# Patient Record
Sex: Female | Born: 1975
Health system: Southern US, Community
[De-identification: ages and names within clinical notes are randomized; demographics above are authoritative.]

## PROBLEM LIST (undated history)

## (undated) DIAGNOSIS — E663 Overweight: Secondary | ICD-10-CM

## (undated) DIAGNOSIS — F419 Anxiety disorder, unspecified: Secondary | ICD-10-CM

## (undated) DIAGNOSIS — J45909 Unspecified asthma, uncomplicated: Secondary | ICD-10-CM

## (undated) DIAGNOSIS — R112 Nausea with vomiting, unspecified: Secondary | ICD-10-CM

## (undated) DIAGNOSIS — R7303 Prediabetes: Secondary | ICD-10-CM

## (undated) DIAGNOSIS — K219 Gastro-esophageal reflux disease without esophagitis: Secondary | ICD-10-CM

## (undated) DIAGNOSIS — D649 Anemia, unspecified: Secondary | ICD-10-CM

## (undated) DIAGNOSIS — R519 Headache, unspecified: Secondary | ICD-10-CM

## (undated) DIAGNOSIS — Z9889 Other specified postprocedural states: Secondary | ICD-10-CM

## (undated) DIAGNOSIS — M722 Plantar fascial fibromatosis: Secondary | ICD-10-CM

## (undated) DIAGNOSIS — C801 Malignant (primary) neoplasm, unspecified: Secondary | ICD-10-CM

## (undated) DIAGNOSIS — T8859XA Other complications of anesthesia, initial encounter: Secondary | ICD-10-CM

## (undated) DIAGNOSIS — Z8489 Family history of other specified conditions: Secondary | ICD-10-CM

## (undated) DIAGNOSIS — Z87442 Personal history of urinary calculi: Secondary | ICD-10-CM

## (undated) HISTORY — PX: INNER EAR SURGERY: SHX679

## (undated) HISTORY — DX: Plantar fascial fibromatosis: M72.2

## (undated) HISTORY — PX: COLONOSCOPY: SHX174

## (undated) HISTORY — PX: WISDOM TOOTH EXTRACTION: SHX21

## (undated) HISTORY — DX: Overweight: E66.3

## (undated) HISTORY — PX: CHOLESTEATOMA EXCISION: SHX1345

## (undated) HISTORY — DX: Malignant (primary) neoplasm, unspecified: C80.1

---

## 2005-06-30 ENCOUNTER — Ambulatory Visit: Payer: Self-pay | Admitting: Unknown Physician Specialty

## 2007-09-23 ENCOUNTER — Ambulatory Visit: Payer: Self-pay | Admitting: Unknown Physician Specialty

## 2008-07-10 ENCOUNTER — Ambulatory Visit: Payer: Self-pay

## 2010-04-11 ENCOUNTER — Ambulatory Visit: Payer: Self-pay | Admitting: Unknown Physician Specialty

## 2011-03-13 ENCOUNTER — Other Ambulatory Visit: Payer: Self-pay | Admitting: Internal Medicine

## 2011-03-13 MED ORDER — ZOLPIDEM TARTRATE 10 MG PO TABS: 10.0000 mg | ORAL_TABLET | Freq: Every evening | ORAL | Status: DC | PRN

## 2011-03-16 ENCOUNTER — Other Ambulatory Visit: Payer: Self-pay | Admitting: Internal Medicine

## 2011-03-20 MED ORDER — ZOLPIDEM TARTRATE 10 MG PO TABS: 10.0000 mg | ORAL_TABLET | Freq: Every evening | ORAL | Status: DC | PRN

## 2011-06-09 LAB — HM PAP SMEAR: HM Pap smear: NORMAL

## 2011-06-20 DIAGNOSIS — C801 Malignant (primary) neoplasm, unspecified: Secondary | ICD-10-CM | POA: Insufficient documentation

## 2011-06-20 HISTORY — PX: APPENDECTOMY: SHX54

## 2011-06-20 HISTORY — PX: LEFT OOPHORECTOMY: SHX1961

## 2011-06-20 HISTORY — DX: Malignant (primary) neoplasm, unspecified: C80.1

## 2011-06-23 ENCOUNTER — Ambulatory Visit: Payer: Self-pay | Admitting: Obstetrics & Gynecology

## 2011-06-23 LAB — CBC
HCT: 40.1 % (ref 35.0–47.0)
HGB: 13.6 g/dL (ref 12.0–16.0)
MCH: 30.5 pg (ref 26.0–34.0)
MCHC: 33.9 g/dL (ref 32.0–36.0)
MCV: 90 fL (ref 80–100)
Platelet: 296 10*3/uL (ref 150–440)
RBC: 4.45 10*6/uL (ref 3.80–5.20)
RDW: 12.6 % (ref 11.5–14.5)
WBC: 4.9 10*3/uL (ref 3.6–11.0)

## 2011-06-30 ENCOUNTER — Ambulatory Visit: Payer: Self-pay | Admitting: Obstetrics & Gynecology

## 2011-07-20 LAB — PATHOLOGY REPORT

## 2011-07-26 ENCOUNTER — Ambulatory Visit: Payer: Self-pay | Admitting: Gynecologic Oncology

## 2011-08-08 ENCOUNTER — Ambulatory Visit: Payer: Self-pay | Admitting: Gynecologic Oncology

## 2011-08-16 ENCOUNTER — Ambulatory Visit: Payer: Self-pay | Admitting: Gynecologic Oncology

## 2011-08-16 LAB — BASIC METABOLIC PANEL
Anion Gap: 11 (ref 7–16)
Calcium, Total: 9.2 mg/dL (ref 8.5–10.1)
Chloride: 105 mmol/L (ref 98–107)
Co2: 26 mmol/L (ref 21–32)
Creatinine: 0.65 mg/dL (ref 0.60–1.30)
EGFR (African American): >60
EGFR (Non-African Amer.): >60
Osmolality: 282 (ref 275–301)

## 2011-08-16 LAB — CBC
HCT: 39.3 % (ref 35.0–47.0)
HGB: 13.2 g/dL (ref 12.0–16.0)
MCH: 30.1 pg (ref 26.0–34.0)
MCHC: 33.5 g/dL (ref 32.0–36.0)
MCV: 90 fL (ref 80–100)
RBC: 4.38 10*6/uL (ref 3.80–5.20)
RDW: 12.6 % (ref 11.5–14.5)

## 2011-08-18 ENCOUNTER — Ambulatory Visit: Payer: Self-pay | Admitting: Gynecologic Oncology

## 2011-08-22 ENCOUNTER — Other Ambulatory Visit: Payer: Self-pay | Admitting: Internal Medicine

## 2011-08-22 ENCOUNTER — Ambulatory Visit: Payer: Self-pay | Admitting: Gynecologic Oncology

## 2011-08-22 LAB — PREGNANCY, URINE: Pregnancy Test, Urine: NEGATIVE m[IU]/mL

## 2011-08-23 MED ORDER — ZOLPIDEM TARTRATE 10 MG PO TABS: 10.0000 mg | ORAL_TABLET | Freq: Every evening | ORAL | Status: DC | PRN

## 2011-08-25 LAB — PATHOLOGY REPORT

## 2011-09-14 ENCOUNTER — Ambulatory Visit (INDEPENDENT_AMBULATORY_CARE_PROVIDER_SITE_OTHER): Payer: 59 | Admitting: Internal Medicine

## 2011-09-14 ENCOUNTER — Encounter: Payer: Self-pay | Admitting: Internal Medicine

## 2011-09-14 VITALS — BP 112/64 | HR 110 | Temp 98.2°F | Resp 16 | Ht 66.0 in | Wt 178.5 lb

## 2011-09-14 DIAGNOSIS — C562 Malignant neoplasm of left ovary: Secondary | ICD-10-CM

## 2011-09-14 DIAGNOSIS — R41840 Attention and concentration deficit: Secondary | ICD-10-CM

## 2011-09-14 DIAGNOSIS — Z6825 Body mass index (BMI) 25.0-25.9, adult: Secondary | ICD-10-CM

## 2011-09-14 DIAGNOSIS — C569 Malignant neoplasm of unspecified ovary: Secondary | ICD-10-CM

## 2011-09-14 DIAGNOSIS — E663 Overweight: Secondary | ICD-10-CM

## 2011-09-14 DIAGNOSIS — F988 Other specified behavioral and emotional disorders with onset usually occurring in childhood and adolescence: Secondary | ICD-10-CM

## 2011-09-14 MED ORDER — BUPROPION HCL 75 MG PO TABS: 75.0000 mg | ORAL_TABLET | Freq: Two times a day (BID) | ORAL | Status: DC

## 2011-09-14 NOTE — Patient Instructions (Signed)
We are going to try wellbutrin 75 mg twice daily for your concentration issues.  Take 1st dose when you wake up,  2nd dose around 1 pm to avoid sleep problems.  Return in one month  If you are tolerating well ,  But feel no difference at 2 weeks,   Call for an increased dose (100 mg)

## 2011-09-14 NOTE — Progress Notes (Signed)
Patient ID: Dawn [name], female   DOB: [birth year], 36 y.o.   MRN: [id number]  Patient Active Problem List  Diagnoses  . Overweight (BMI 25.0-29.9)  . Plantar fasciitis  . Attention and concentration deficit  . Ovarian cancer on left    Subjective:  CC:   Chief Complaint  Patient presents with  . Follow-up    problems focusing    HPI:   Dawn Lawrenceis a 36 y.o. female who presents with a 3 month history of trouble focusing at work,  Noticed by patient and coworkers.  Initially she attributed her lack of concentration to the  stress of her recent diagnosis of ovarian cancer. She has no prior  diagnosis of ADD but she recalls that since high school, she has been inattentive during and  recalling conversations with other people.  She finds herself looking around the room and losing interest after a minute or less of conversation.  ("I've always been a multitasker, but now I can't seem to do as many things as one.)  Has chronic sleep issues because of a "neurologic jump" into REM which wakes up her up (sleep study by [employer] 2010,  Controlled with Remus Loffler)   Recent stressor includes diagnosis of granulosa cell CA , left ovary, s/p oophorectomy.  Several months ago she felt a lower abdominal mass and her annual GYN exam in November revealed an enlarged uterus.   Ultrasound in Dec which showed 14 cm mass,  Acc by increased pain and bleeding which had been attributed to prior remote C section.  January  Underwent laparatomy by Dr. Tiburcio Pea, [facility] showed a large cyst, they drained it and removed 1/2 of left ovary and left tube,  Path report was Granulosa cell tumor  3 mm,  Contained. Referred to Oncology.  Wendy Poet, [facility], was told this type of ovarian ca was unlikely to return,  But pt wanted entire ovary and tube removed, so during removal  no tube was found on the right .   So patient now is dealing with the realization that she definitely will never conceive  again.       Past Medical History  Diagnosis Date  . Overweight (BMI 25.0-29.9)   . Plantar fasciitis     Past Surgical History  Procedure Date  . Cesarean section 2004    breech birth  . Cholesteatoma excision     [facility], with some resiudal loss of hearing         The following portions of the patient's history were reviewed and updated as appropriate: Allergies, current medications, and problem list.    Review of Systems:   12 Pt  review of systems was negative except those addressed in the HPI,     History   Social History  . Marital Status: Married    Spouse Name: N/A    Number of Children: N/A  . Years of Education: N/A   Occupational History  . Not on file.   Social History Main Topics  . Smoking status: Never Smoker   . Smokeless tobacco: Never Used  . Alcohol Use: Yes  . Drug Use: No  . Sexually Active: Not on file   Other Topics Concern  . Not on file   Social History Narrative  . No narrative on file    Objective:  BP 112/64  Pulse 110  Temp(Src) 98.2 F (36.8 C) (Oral)  Resp 16  Ht 5\' 6"  (1.676 m)  Wt 178 lb 8 oz (  80.967 kg)  BMI 28.81 kg/m2  SpO2 100%  LMP 08/23/2011  General appearance: alert, cooperative and appears stated age Ears: normal TM's and external ear canals both ears Throat: lips, mucosa, and tongue normal; teeth and gums normal Neck: no adenopathy, no carotid bruit, supple, symmetrical, trachea midline and thyroid not enlarged, symmetric, no tenderness/mass/nodules Back: symmetric, no curvature. ROM normal. No CVA tenderness. Lungs: clear to auscultation bilaterally Heart: regular rate and rhythm, S1, S2 normal, no murmur, click, rub or gallop Abdomen: soft, non-tender; bowel sounds normal; no masses,  no organomegaly Pulses: 2+ and symmetric Skin: Skin color, texture, turgor normal. No rashes or lesions Lymph nodes: Cervical, supraclavicular, and axillary nodes normal.  Assessment and Plan:  Attention and  concentration deficit Discussed need for formal evaluation prior to using stimulant therapy given her history of insomnia .  Trial of wellbutrin first.   Ovarian cancer on left Found in January during resection of left ovarian cyst.  She is s/p completel resection, granulosa cell,  Not likely to return per Oncology.   Overweight (BMI 25.0-29.9) She has a history of phentermine use , prescribed by me in June 2011 for persistent weight gain despite regular exercise and carbohydrate restriction.  At that time her weight was 177 and she lost 15 lbs in 4 months before stopping the medication.  She has regained 16 lbs but has not been exercising due to her recent surgery. I have addressed  BMI and recommended a low glycemic index diet utilizing smaller more frequent meals to increase metabolism.  I have also recommended that patient start exercising with a goal of 30 minutes of aerobic exercise a minimum of 5 days per week.        Updated Medication List Outpatient Encounter Prescriptions as of 09/14/2011  Medication Sig Dispense Refill  . cetirizine (ZYRTEC) 10 MG tablet Take 10 mg by mouth daily.      [name] docusate sodium (COLACE) 50 MG capsule Take by mouth 2 (two) times daily.      [name] FIBER FORMULA PO Take by mouth.      . Multiple Vitamin (MULTIVITAMIN) tablet Take 1 tablet by mouth daily.      . vitamin B-12 (CYANOCOBALAMIN) 100 MCG tablet Take 50 mcg by mouth daily.      [name] zolpidem (AMBIEN) 10 MG tablet Take 5 mg by mouth at bedtime as needed.      [name] DISCONTD: zolpidem (AMBIEN) 10 MG tablet Take 1 tablet (10 mg total) by mouth at bedtime as needed for sleep.  30 tablet  2  . buPROPion (WELLBUTRIN) 75 MG tablet Take 1 tablet (75 mg total) by mouth 2 (two) times daily.  60 tablet  1     No orders of the defined types were placed in this encounter.    No Follow-up on file.

## 2011-09-17 ENCOUNTER — Encounter: Payer: Self-pay | Admitting: Internal Medicine

## 2011-09-17 DIAGNOSIS — Z8543 Personal history of malignant neoplasm of ovary: Secondary | ICD-10-CM | POA: Insufficient documentation

## 2011-09-17 DIAGNOSIS — R41840 Attention and concentration deficit: Secondary | ICD-10-CM | POA: Insufficient documentation

## 2011-09-17 DIAGNOSIS — C562 Malignant neoplasm of left ovary: Secondary | ICD-10-CM | POA: Insufficient documentation

## 2011-09-17 DIAGNOSIS — Z6826 Body mass index (BMI) 26.0-26.9, adult: Secondary | ICD-10-CM | POA: Insufficient documentation

## 2011-09-17 DIAGNOSIS — E663 Overweight: Secondary | ICD-10-CM | POA: Insufficient documentation

## 2011-09-17 DIAGNOSIS — M722 Plantar fascial fibromatosis: Secondary | ICD-10-CM | POA: Insufficient documentation

## 2011-09-17 NOTE — Assessment & Plan Note (Signed)
Discussed need for formal evaluation prior to using stimulant therapy given her history of insomnia .  Trial of wellbutrin first.

## 2011-09-17 NOTE — Assessment & Plan Note (Addendum)
She has a history of phentermine use , prescribed by me in June 2011 for persistent weight gain despite regular exercise and carbohydrate restriction.  At that time her weight was 177 and she lost 15 lbs in 4 months before stopping the medication.  She has regained 16 lbs but has not been exercising due to her recent surgery. I have addressed  BMI and recommended a low glycemic index diet utilizing smaller more frequent meals to increase metabolism.  I have also recommended that patient start exercising with a goal of 30 minutes of aerobic exercise a minimum of 5 days per week.

## 2011-09-17 NOTE — Assessment & Plan Note (Signed)
Found in January during resection of left ovarian cyst.  She is s/p completel resection, granulosa cell,  Not likely to return per Oncology.

## 2011-09-18 ENCOUNTER — Ambulatory Visit: Payer: Self-pay | Admitting: Gynecologic Oncology

## 2011-09-21 ENCOUNTER — Telehealth: Payer: Self-pay | Admitting: Internal Medicine

## 2011-09-21 ENCOUNTER — Encounter: Payer: Self-pay | Admitting: Internal Medicine

## 2011-09-21 NOTE — Telephone Encounter (Signed)
[phone] Pt called she has been trying to loose weight and her employer is offier nutrion counceling and she needs referral for this.  She will fax paperwork for you to fill out and fax back

## 2011-09-21 NOTE — Telephone Encounter (Signed)
We received the paperwork, and it is in the red folder to be signed.  I will fax once it has been filled out.

## 2011-09-28 ENCOUNTER — Telehealth: Payer: Self-pay | Admitting: Internal Medicine

## 2011-09-28 DIAGNOSIS — F988 Other specified behavioral and emotional disorders with onset usually occurring in childhood and adolescence: Secondary | ICD-10-CM

## 2011-09-28 MED ORDER — BUPROPION HCL 100 MG PO TABS: 100.0000 mg | ORAL_TABLET | Freq: Two times a day (BID) | ORAL | Status: DC

## 2011-09-28 NOTE — Telephone Encounter (Signed)
Patient called and stated you asked her to call and let you know how the Wellbutrin is working for her.  She stated she hasn't noticed it helping with her focusing, but she has noticed that she is not as angry and doesn't get as upset as she normally would with certain situations.

## 2011-09-28 NOTE — Telephone Encounter (Signed)
I would recommend increasing dose to 100 mg twice daily .  New rx in EPIc

## 2011-09-29 MED ORDER — BUPROPION HCL 100 MG PO TABS: 100.0000 mg | ORAL_TABLET | Freq: Two times a day (BID) | ORAL | Status: DC

## 2011-09-29 NOTE — Telephone Encounter (Signed)
Patient notified, Rx called in. 

## 2011-10-12 ENCOUNTER — Telehealth: Payer: Self-pay | Admitting: Internal Medicine

## 2011-10-12 ENCOUNTER — Other Ambulatory Visit: Payer: Self-pay | Admitting: Internal Medicine

## 2011-10-12 DIAGNOSIS — F988 Other specified behavioral and emotional disorders with onset usually occurring in childhood and adolescence: Secondary | ICD-10-CM

## 2011-10-12 MED ORDER — BUPROPION HCL ER (SR) 150 MG PO TB12: 150.0000 mg | ORAL_TABLET | Freq: Two times a day (BID) | ORAL | Status: DC

## 2011-10-12 NOTE — Telephone Encounter (Signed)
i recommend increasing the dose of wellbutrin  to 150 mg twice daily ,  i have called it in to her pharmacy,  If she wants to push her may 1 appt out to fgice the increase time,  Move it to June

## 2011-10-12 NOTE — Telephone Encounter (Signed)
Patient stated she has not noticed a change in the increased dose of Wellbutrin, she wanted to know what is the next step.  Please advise.

## 2011-10-12 NOTE — Telephone Encounter (Signed)
[phone] Pt called stated her wellbutrin still is not working very well.  Please advise pt what to do.  Pt has appointment 10/18/11 pt wanted to know if she needs to come in for this appointment or should she push it out later in the month

## 2011-10-13 NOTE — Telephone Encounter (Signed)
Patient notified, she rescheduled for June 4th.

## 2011-10-17 ENCOUNTER — Ambulatory Visit: Payer: 59 | Admitting: Internal Medicine

## 2011-10-18 ENCOUNTER — Ambulatory Visit: Payer: 59 | Admitting: Internal Medicine

## 2011-11-21 ENCOUNTER — Ambulatory Visit (INDEPENDENT_AMBULATORY_CARE_PROVIDER_SITE_OTHER): Payer: 59 | Admitting: Internal Medicine

## 2011-11-21 ENCOUNTER — Encounter: Payer: Self-pay | Admitting: Internal Medicine

## 2011-11-21 VITALS — BP 112/68 | HR 93 | Temp 98.1°F | Resp 16 | Wt 176.5 lb

## 2011-11-21 DIAGNOSIS — C562 Malignant neoplasm of left ovary: Secondary | ICD-10-CM

## 2011-11-21 DIAGNOSIS — C569 Malignant neoplasm of unspecified ovary: Secondary | ICD-10-CM

## 2011-11-21 DIAGNOSIS — R41840 Attention and concentration deficit: Secondary | ICD-10-CM

## 2011-11-21 DIAGNOSIS — Z13220 Encounter for screening for lipoid disorders: Secondary | ICD-10-CM

## 2011-11-21 DIAGNOSIS — E663 Overweight: Secondary | ICD-10-CM

## 2011-11-21 DIAGNOSIS — R5383 Other fatigue: Secondary | ICD-10-CM

## 2011-11-21 LAB — CBC WITH DIFFERENTIAL/PLATELET
Basophils Absolute: 0 10*3/uL (ref 0.0–0.1)
Basophils Relative: 0.4 % (ref 0.0–3.0)
Eosinophils Absolute: 0.1 10*3/uL (ref 0.0–0.7)
Lymphocytes Relative: 32.5 % (ref 12.0–46.0)
MCHC: 33 g/dL (ref 30.0–36.0)
Monocytes Relative: 8.5 % (ref 3.0–12.0)
Neutrophils Relative %: 56.9 % (ref 43.0–77.0)
RBC: 4.48 Mil/uL (ref 3.87–5.11)

## 2011-11-21 LAB — TSH: TSH: 1.01 u[IU]/mL (ref 0.35–5.50)

## 2011-11-21 LAB — LIPID PANEL
Cholesterol: 205 mg/dL — ABNORMAL HIGH (ref 0–200)
Total CHOL/HDL Ratio: 4
Triglycerides: 184 mg/dL — ABNORMAL HIGH (ref 0.0–149.0)

## 2011-11-21 MED ORDER — BUPROPION HCL ER (XL) 300 MG PO TB24: 300.0000 mg | ORAL_TABLET | Freq: Every day | ORAL | Status: DC

## 2011-11-21 NOTE — Patient Instructions (Signed)
Consider the Low Glycemic Index Diet and 6 smaller meals daily .  This boosts your metabolism and regulates your sugars:   7 AM Low carbohydrate Protein  Shakes (EAS Carb Control  Or Atkins ,  Available everywhere,   In  cases at [employer] )  2.5 carbs  (Add or substitute an Arnold's  toasted sandwhich thin w/ peanut butter)  10 AM: Protein bar by Atkins (snack size,  Chocolate lover's variety at  [employer])    Lunch: sandwich on pita bread or flatbread (Joseph's makes a pita bread and a flat bread , available at [facility] and [employer] (both are 50 cal and 3 net carbs) ; Toufayah makes a low carb flatbread available at [employer] and [employer]) Mission makes a low carb whole wheat tortilla available at [employer] most grocery stores   3 PM:  Mid day :  Another protein bar,  Or a  cheese stick, 1/4 cup of almonds, walnuts, pistachios, pecans, peanuts,  Macadamia nuts  6 PM  Dinner:  "mean and green:"  Meat/chicken/fish, salad, and green veggie : use ranch, vinagrette,  Blue cheese, etc.  Avoid FAT FREE   9 PM snack : Breyer's low carb fudgsicle (100 cal,  3 carbs)  or  ice cream bar (Carb Smart), or  Weight Watcher's ice cream bar , or another protein shake  substitutions should be < 20 carbs/serving

## 2011-11-21 NOTE — Assessment & Plan Note (Signed)
We discussed increasing the wellbutrin to 300 mg daily vs starting ritalin.   since the wellbutrin is also helping her mood, we agreed to increase the dose first rather than switching.

## 2011-11-21 NOTE — Assessment & Plan Note (Signed)
Patient is addressing weight with low glycemic index diet [facility])

## 2011-11-21 NOTE — Assessment & Plan Note (Signed)
ganulosa cell, per Dr. Hyacinth Meeker, not likely to recur,  S/p oophorectomy, left, with missing fallopian tube on the right.

## 2011-11-21 NOTE — Progress Notes (Signed)
Patient ID: Dawn [name], female   DOB: 19-Apr-1976, 36 y.o.   MRN: [id number] Patient Active Problem List  Diagnoses  . Overweight (BMI 25.0-29.9)  . Plantar fasciitis  . Attention and concentration deficit  . Ovarian cancer on left  . granulosa cell tumor    Subjective:  CC:   Chief Complaint  Patient presents with  . Follow-up    HPI:   Dawn Lawrenceis a 36 y.o. female who presents for follow up on newly diagnosed attention deficit issues.  In the last 2 months she has suffered from recurrent vaginal infections which Dr. Hyacinth Meeker told her was common post oopherectomy/laparatomy , all of which were treated with metrogel.   She has been tolerating wellbutrin, feels it is helping her mood.  She is experiencing less irritability, but it has had no effect on her ability to concentrate and focus on work yet.  She is not taking the phentermine and has not used it in months.  She started [employer] yesterday for weight loss but did not tolerate the dinner last night due to increased bloating from the vegetables .    Past Medical History  Diagnosis Date  . Overweight (BMI 25.0-29.9)   . Plantar fasciitis   . granulosa cell tumor Jan 2013    left ovary     Past Surgical History  Procedure Date  . Cesarean section 2004    breech birth  . Cholesteatoma excision     [facility], with some resiudal loss of hearing  . Left oophorectomy 2013    secondary to granulosa cell tumor         The following portions of the patient's history were reviewed and updated as appropriate: Allergies, current medications, and problem list.    Review of Systems:   12 Pt  review of systems was negative except those addressed in the HPI.     History   Social History  . Marital Status: Married    Spouse Name: N/A    Number of Children: N/A  . Years of Education: N/A   Occupational History  . Not on file.   Social History Main Topics  . Smoking status: Never Smoker   . Smokeless  tobacco: Never Used  . Alcohol Use: Yes  . Drug Use: No  . Sexually Active: Not on file   Other Topics Concern  . Not on file   Social History Narrative  . No narrative on file    Objective:  BP 112/68  Pulse 93  Temp(Src) 98.1 F (36.7 C) (Oral)  Resp 16  Wt 176 lb 8 oz (80.06 kg)  SpO2 97%  LMP 11/04/2011  General appearance: alert, cooperative and appears stated age Ears: normal TM's and external ear canals both ears Throat: lips, mucosa, and tongue normal; teeth and gums normal Neck: no adenopathy, no carotid bruit, supple, symmetrical, trachea midline and thyroid not enlarged, symmetric, no tenderness/mass/nodules Back: symmetric, no curvature. ROM normal. No CVA tenderness. Lungs: clear to auscultation bilaterally Heart: regular rate and rhythm, S1, S2 normal, no murmur, click, rub or gallop Abdomen: soft, non-tender; bowel sounds normal; no masses,  no organomegaly Pulses: 2+ and symmetric Skin: Skin color, texture, turgor normal. No rashes or lesions Lymph nodes: Cervical, supraclavicular, and axillary nodes normal.  Assessment and Plan:  Attention and concentration deficit We discussed increasing the wellbutrin to 300 mg daily vs starting ritalin.   since the wellbutrin is also helping her mood, we agreed to increase the dose first rather  than switching.   Ovarian cancer on left ganulosa cell, per Dr. Hyacinth Meeker, not likely to recur,  S/p oophorectomy, left, with missing fallopian tube on the right.   Overweight (BMI 25.0-29.9) Patient is addressing weight with low glycemic index diet [facility])    Updated Medication List Outpatient Encounter Prescriptions as of 11/21/2011  Medication Sig Dispense Refill  . cetirizine (ZYRTEC) 10 MG tablet Take 10 mg by mouth daily.      [name] docusate sodium (COLACE) 50 MG capsule Take by mouth 2 (two) times daily.      [name] FIBER FORMULA PO Take by mouth.      . Multiple Vitamin (MULTIVITAMIN) tablet Take 1 tablet by mouth  daily.      [name] pyridOXINE (VITAMIN B-6) 100 MG tablet Take 100 mg by mouth daily.      . vitamin B-12 (CYANOCOBALAMIN) 100 MCG tablet Take 50 mcg by mouth daily.      [name] zolpidem (AMBIEN) 10 MG tablet Take 1 tablet (10 mg total) by mouth at bedtime as needed for sleep.  30 tablet  2  . DISCONTD: buPROPion (WELLBUTRIN SR) 150 MG 12 hr tablet Take 1 tablet (150 mg total) by mouth 2 (two) times daily.  60 tablet  1  . buPROPion (WELLBUTRIN XL) 300 MG 24 hr tablet Take 1 tablet (300 mg total) by mouth daily.  30 tablet  1  . DISCONTD: zolpidem (AMBIEN) 10 MG tablet Take 5 mg by mouth at bedtime as needed.         Orders Placed This Encounter  Procedures  . TSH  . Lipid panel  . COMPLETE METABOLIC PANEL WITH GFR  . CBC with Differential  . LDL cholesterol, direct    No Follow-up on file.

## 2011-11-22 LAB — COMPLETE METABOLIC PANEL WITH GFR
ALT: 21 U/L (ref 0–35)
BUN: 15 mg/dL (ref 6–23)
CO2: 22 mEq/L (ref 19–32)
Calcium: 9.2 mg/dL (ref 8.4–10.5)
Chloride: 100 mEq/L (ref 96–112)
Creat: 0.64 mg/dL (ref 0.50–1.10)
GFR, Est African American: >89 mL/min
GFR, Est Non African American: >89 mL/min
Glucose, Bld: 83 mg/dL (ref 70–99)
Total Bilirubin: 0.7 mg/dL (ref 0.3–1.2)

## 2011-12-06 ENCOUNTER — Encounter: Payer: Self-pay | Admitting: Internal Medicine

## 2011-12-08 ENCOUNTER — Other Ambulatory Visit: Payer: Self-pay | Admitting: Internal Medicine

## 2011-12-08 MED ORDER — PHENTERMINE HCL 37.5 MG PO TABS: 18.5000 mg | ORAL_TABLET | Freq: Two times a day (BID) | ORAL | Status: DC

## 2011-12-27 ENCOUNTER — Encounter: Payer: Self-pay | Admitting: Internal Medicine

## 2011-12-27 DIAGNOSIS — K5909 Other constipation: Secondary | ICD-10-CM

## 2011-12-28 MED ORDER — LUBIPROSTONE 24 MCG PO CAPS: 24.0000 ug | ORAL_CAPSULE | Freq: Two times a day (BID) | ORAL | Status: DC

## 2012-01-01 ENCOUNTER — Other Ambulatory Visit: Payer: Self-pay | Admitting: *Deleted

## 2012-01-01 DIAGNOSIS — K5909 Other constipation: Secondary | ICD-10-CM

## 2012-01-01 NOTE — Telephone Encounter (Signed)
Opened in error

## 2012-01-04 ENCOUNTER — Other Ambulatory Visit: Payer: Self-pay | Admitting: *Deleted

## 2012-01-04 DIAGNOSIS — K5909 Other constipation: Secondary | ICD-10-CM

## 2012-01-04 MED ORDER — LUBIPROSTONE 24 MCG PO CAPS: 24.0000 ug | ORAL_CAPSULE | Freq: Two times a day (BID) | ORAL | Status: DC

## 2012-01-19 ENCOUNTER — Other Ambulatory Visit: Payer: Self-pay | Admitting: Internal Medicine

## 2012-02-06 ENCOUNTER — Other Ambulatory Visit: Payer: Self-pay | Admitting: *Deleted

## 2012-02-06 MED ORDER — ZOLPIDEM TARTRATE 10 MG PO TABS: 10.0000 mg | ORAL_TABLET | Freq: Every evening | ORAL | Status: DC | PRN

## 2012-02-08 ENCOUNTER — Other Ambulatory Visit: Payer: Self-pay | Admitting: Internal Medicine

## 2012-02-12 ENCOUNTER — Other Ambulatory Visit: Payer: Self-pay | Admitting: Internal Medicine

## 2012-04-24 ENCOUNTER — Other Ambulatory Visit: Payer: Self-pay | Admitting: Internal Medicine

## 2012-05-26 ENCOUNTER — Other Ambulatory Visit: Payer: Self-pay | Admitting: Internal Medicine

## 2012-06-05 ENCOUNTER — Other Ambulatory Visit: Payer: Self-pay | Admitting: Internal Medicine

## 2012-06-06 NOTE — Telephone Encounter (Signed)
Dr. Darrick Huntsman:  I am routing this script which has a controlled substance in it.   Asher Muir

## 2012-06-25 ENCOUNTER — Other Ambulatory Visit: Payer: Self-pay | Admitting: Internal Medicine

## 2012-06-26 ENCOUNTER — Encounter: Payer: Self-pay | Admitting: Internal Medicine

## 2012-06-26 MED ORDER — PHENTERMINE HCL 37.5 MG PO TABS: 18.5000 mg | ORAL_TABLET | Freq: Two times a day (BID) | ORAL | Status: DC

## 2012-08-03 ENCOUNTER — Other Ambulatory Visit: Payer: Self-pay

## 2012-08-06 ENCOUNTER — Encounter: Payer: Self-pay | Admitting: Internal Medicine

## 2012-08-12 ENCOUNTER — Encounter: Payer: Self-pay | Admitting: Internal Medicine

## 2012-08-12 ENCOUNTER — Ambulatory Visit (INDEPENDENT_AMBULATORY_CARE_PROVIDER_SITE_OTHER): Payer: 59 | Admitting: Internal Medicine

## 2012-08-12 VITALS — BP 102/64 | HR 99 | Temp 98.5°F | Resp 16 | Wt 173.5 lb

## 2012-08-12 DIAGNOSIS — J069 Acute upper respiratory infection, unspecified: Secondary | ICD-10-CM | POA: Insufficient documentation

## 2012-08-12 DIAGNOSIS — J040 Acute laryngitis: Secondary | ICD-10-CM

## 2012-08-12 DIAGNOSIS — J020 Streptococcal pharyngitis: Secondary | ICD-10-CM

## 2012-08-12 DIAGNOSIS — R41840 Attention and concentration deficit: Secondary | ICD-10-CM

## 2012-08-12 MED ORDER — AMPHETAMINE-DEXTROAMPHETAMINE 10 MG PO TABS: 10.0000 mg | ORAL_TABLET | Freq: Two times a day (BID) | ORAL | Status: DC

## 2012-08-12 MED ORDER — HYDROCODONE-HOMATROPINE 5-1.5 MG/5ML PO SYRP: 5.0000 mL | ORAL_SOLUTION | Freq: Four times a day (QID) | ORAL | Status: DC | PRN

## 2012-08-12 MED ORDER — BENZONATATE 200 MG PO CAPS: 200.0000 mg | ORAL_CAPSULE | Freq: Three times a day (TID) | ORAL | Status: DC | PRN

## 2012-08-12 MED ORDER — CULTURELLE PO CAPS: 1.0000 | ORAL_CAPSULE | Freq: Every day | ORAL | Status: DC

## 2012-08-12 MED ORDER — PREDNISONE (PAK) 10 MG PO TABS: ORAL_TABLET | ORAL | Status: DC

## 2012-08-12 MED ORDER — LEVOFLOXACIN 500 MG PO TABS: 500.0000 mg | ORAL_TABLET | Freq: Every day | ORAL | Status: DC

## 2012-08-12 NOTE — Assessment & Plan Note (Signed)
Trial fo low dose ritalin bid,  Return in one month,.  Phentermine discontinued.  husbnad to check BP/pulse in a few days for adverse effects.

## 2012-08-12 NOTE — Assessment & Plan Note (Addendum)
Strep test normal. Symptoms of  URI are caused by viral infection currently given her current symptoms.   I have explained that in viral URIS, an antibiotic will not help the symptoms and will increase the risk of developing diarrhea. Advised to use oral and nasal decongestants,  Ibuprofen 400 mg and tylenol 650 mq 8 hrs for aches and pains,  tessalon every 8 hours prn cough  Advised to start round of abx only if symptoms worsen to include fevers, facial pain, purulent sputum./drainage.

## 2012-08-12 NOTE — Patient Instructions (Addendum)
You have a viral  Syndrome .  The post nasal drip is causing your sore throat.  Lavage your sinuses twice daly with Simply saline nasal spray.  Use benadryl 25 mg every 8 hours and Sudafed PE 10 to 30 every 8 hours to manage the drainage and congestion.  Gargle with salt water often for the sore throat.  I am calling in tessalon couph capsules to use during the day and hycodoan cough syrup to use at night   If the throat is no better  In 3 to 4 days OR  if you develop T > 100.4,  Green nasal discharge,  Ear or facial pain,  Start the antibiotic. If you start the antibiotic,  i recommend starting align ,  Flora que or culturelle (probiotics) while you are taking it   -------------------------------------------------------------------------  I am starting you on Adderall 10 mg twice daily for your attention deficit problems  Please have your bp and pulse checked in a few days and no not take phentermine anymore  Return in one month

## 2012-08-12 NOTE — Progress Notes (Signed)
Patient ID: Dawn [name], female   DOB: [birth year], 37 y.o.   MRN: [id number]  Patient Active Problem List  Diagnosis  . Overweight (BMI 25.0-29.9)  . Plantar fasciitis  . Attention and concentration deficit  . Ovarian cancer on left  . granulosa cell tumor  . Laryngitis, acute  . Viral URI with cough    Subjective:  CC:   Chief Complaint  Patient presents with  . Sore Throat    HPI:   Dawn Lawrenceis a 37 y.o. female who presents  With:   1) Sore throat started last Thursday . No fevers, myalgias, nausea, vomiting or diarrhea. No wheezing . Recent sick contacts.  Having copius amounts  of clear to yellow sinus drainage and now having laryngitis .  Cough keeping her up at night.   2) decreased concentration.  She has been taking wellbutrin for several months and has noticed an improvement in mood but no improvement in her ability to conentrate and finish complicated tasks at work.  She is requesting a trial of ritalin.  She is not taking any other stimulants on a regular basis, but uses the phentermine occasionally.     Past Medical History  Diagnosis Date  . Overweight (BMI 25.0-29.9)   . Plantar fasciitis   . granulosa cell tumor Jan 2013    left ovary     Past Surgical History  Procedure Laterality Date  . Cesarean section  2004    breech birth  . Cholesteatoma excision      [facility], with some resiudal loss of hearing  . Left oophorectomy  2013    secondary to granulosa cell tumor       The following portions of the patient's history were reviewed and updated as appropriate: Allergies, current medications, and problem list.    Review of Systems:  Patient denies headache, fevers, malaise, unintentional weight loss, skin rash, eye pain, , dysphagia,  hemoptysis ,  dyspnea, wheezing, chest pain, palpitations, orthopnea, edema, abdominal pain, nausea, melena, diarrhea, constipation, flank pain, dysuria, hematuria, urinary  Frequency, nocturia, numbness, tingling,  seizures,  Focal weakness, Loss of consciousness,  Tremor, insomnia, depression, anxiety, and suicidal ideation.     History   Social History  . Marital Status: Married    Spouse Name: N/A    Number of Children: N/A  . Years of Education: N/A   Occupational History  . Not on file.   Social History Main Topics  . Smoking status: Never Smoker   . Smokeless tobacco: Never Used  . Alcohol Use: Yes  . Drug Use: No  . Sexually Active: Not on file   Other Topics Concern  . Not on file   Social History Narrative  . No narrative on file    Objective:  BP 102/64  Pulse 99  Temp(Src) 98.5 F (36.9 C) (Oral)  Resp 16  Wt 173 lb 8 oz (78.699 kg)  BMI 28.02 kg/m2  SpO2 97%  LMP 08/05/2012  General appearance: alert, cooperative and appears stated age Ears: normal TM's and external ear canals both ears Sinuses:  nontender to palpation  Throat: lips, mucosa, and tongue normal; teeth and gums normal tonsils normal.  Neck: no adenopathy, no carotid bruit, supple, symmetrical, trachea midline and thyroid not enlarged, symmetric, no tenderness/mass/nodules Back: symmetric, no curvature. ROM normal. No CVA tenderness. Lungs: clear to auscultation bilaterally Heart: regular rate and rhythm, S1, S2 normal, no murmur, click, rub or gallop Abdomen: soft, non-tender; bowel sounds normal; no masses,  no  organomegaly Pulses: 2+ and symmetric Skin: Skin color, texture, turgor normal. No rashes or lesions Lymph nodes: Cervical, supraclavicular, and axillary nodes normal.  Assessment and Plan:  Viral URI with cough Strep test was negative. Symptoms of  URI are caused by viral infection currently given her current symptoms.   I have explained that in viral URIS, an antibiotic will not help the symptoms and will increase the risk of developing diarrhea. Advised to use oral and nasal decongestants,  Ibuprofen 400 mg and tylenol 650 mq 8 hrs for aches and pains,  tessalon every 8 hours prn cough   Advised to start round of abx only if symptoms worsen to include fevers, facial pain, purulent sputum./drainage.   Laryngitis, acute Cough supression and prednisone taper  Attention and concentration deficit Trial fo low dose ritalin bid,  Return in one month,.  Phentermine discontinued.  husbnad to check BP/pulse in a few days for adverse effects.    Updated Medication List Outpatient Encounter Prescriptions as of 08/12/2012  Medication Sig Dispense Refill  . AMITIZA 24 MCG capsule TAKE ONE CAPSULE BY MOUTH TWICE A DAY WITH A MEAL  60 capsule  1  . cetirizine (ZYRTEC) 10 MG tablet Take 10 mg by mouth daily.      [name] docusate sodium (COLACE) 50 MG capsule Take by mouth 2 (two) times daily.      [name] FIBER FORMULA PO Take by mouth.      . Multiple Vitamin (MULTIVITAMIN) tablet Take 1 tablet by mouth daily.      [name] pyridOXINE (VITAMIN B-6) 100 MG tablet Take 100 mg by mouth daily.      . vitamin B-12 (CYANOCOBALAMIN) 100 MCG tablet Take 50 mcg by mouth daily.      [name] zolpidem (AMBIEN) 10 MG tablet TAKE 1 TABLET BY MOUTH EVERY NIGHT AT BEDTIME  30 tablet  0  . amphetamine-dextroamphetamine (ADDERALL) 10 MG tablet Take 1 tablet (10 mg total) by mouth 2 (two) times daily.  60 tablet  0  . benzonatate (TESSALON) 200 MG capsule Take 1 capsule (200 mg total) by mouth 3 (three) times daily as needed for cough.  20 capsule  0  . buPROPion (WELLBUTRIN XL) 300 MG 24 hr tablet TAKE 1 TABLET BY MOUTH EVERY DAY  30 tablet  0  . HYDROcodone-homatropine (HYCODAN) 5-1.5 MG/5ML syrup Take 5 mLs by mouth every 6 (six) hours as needed for cough.  120 mL  0  . Lactobacillus Rhamnosus, GG, (CULTURELLE) CAPS Take 1 capsule by mouth daily.  30 capsule  0  . levofloxacin (LEVAQUIN) 500 MG tablet Take 1 tablet (500 mg total) by mouth daily.  7 tablet  0  . phentermine (ADIPEX-P) 37.5 MG tablet Take 0.5 tablets (18.75 mg total) by mouth 2 (two) times daily before a meal.  30 tablet  0  . predniSONE (STERAPRED UNI-PAK) 10 MG  tablet 6 tablets on Day 1 , then reduce by 1 tablet daily until gone  21 tablet  0   No facility-administered encounter medications on file as of 08/12/2012.     Orders Placed This Encounter  Procedures  . POCT rapid strep A    Return in about 4 weeks (around 09/09/2012).

## 2012-08-12 NOTE — Assessment & Plan Note (Signed)
Cough supression and prednisone taper

## 2012-08-30 ENCOUNTER — Telehealth: Payer: Self-pay | Admitting: Internal Medicine

## 2012-09-01 NOTE — Telephone Encounter (Signed)
Ok to refill,  Authorized in [employer]

## 2012-09-02 NOTE — Telephone Encounter (Signed)
Med faxed on 3/17.

## 2012-09-09 ENCOUNTER — Encounter: Payer: Self-pay | Admitting: Internal Medicine

## 2012-09-09 ENCOUNTER — Ambulatory Visit (INDEPENDENT_AMBULATORY_CARE_PROVIDER_SITE_OTHER): Payer: 59 | Admitting: Internal Medicine

## 2012-09-09 VITALS — BP 104/66 | HR 94 | Temp 98.2°F | Resp 16 | Wt 174.8 lb

## 2012-09-09 DIAGNOSIS — R41840 Attention and concentration deficit: Secondary | ICD-10-CM

## 2012-09-09 DIAGNOSIS — J069 Acute upper respiratory infection, unspecified: Secondary | ICD-10-CM

## 2012-09-09 MED ORDER — BENZONATATE 200 MG PO CAPS: 200.0000 mg | ORAL_CAPSULE | Freq: Three times a day (TID) | ORAL | Status: DC | PRN

## 2012-09-09 MED ORDER — METHYLPHENIDATE HCL ER (LA) 10 MG PO CP24: 10.0000 mg | ORAL_CAPSULE | ORAL | Status: DC

## 2012-09-09 NOTE — Patient Instructions (Addendum)
I am changing to your adderall to Ritalin LA (Long acting).  Take one tablet daily in the morning.  You may increase to 2 tablets in the morning after one week if your concentration is suffering.  Let me know if you do this so we can change your prescription.  Your cough is persistent because of the irritation from your last infection Take the tessalon capsules every 6 hours to suppress the cough and contineu to flush sineuses daily with Saline.

## 2012-09-09 NOTE — Progress Notes (Signed)
Patient ID: Dawn [name], female   DOB: [birth year], 37 y.o.   MRN: [id number]   Patient Active Problem List  Diagnosis  . Overweight (BMI 25.0-29.9)  . Plantar fasciitis  . Attention and concentration deficit  . Ovarian cancer on left  . granulosa cell tumor  . Laryngitis, acute  . Viral URI with cough    Subjective:  CC:   Chief Complaint  Patient presents with  . Follow-up    HPI:   Dawn Lawrenceis a 37 y.o. female who presents for 1 month follow up on medication changes to treat adult attention deficit disorder. She was prescribed Adderall at a starting dose of 10 mg twice daily. She has noticed a difference in her concentration in the mornings but frequently forgets to take the afternoon dose and therefore poor performance in the afternoon. Requesting a long-acting medication as an alternative. She has not had any adverse side effects. She has had her blood pressure and pulse checks both at home and at work since starting medication and there have been no significant changes.     2) Persistent cough  Since her upper respiratory infection last month. Prior trials of prednisone for bronchitis and cause muscle soreness. Cough is nonproductive. No fevers or chills. Denies shortness of breath.   Past Medical History  Diagnosis Date  . Overweight (BMI 25.0-29.9)   . Plantar fasciitis   . granulosa cell tumor Jan 2013    left ovary     Past Surgical History  Procedure Laterality Date  . Cesarean section  2004    breech birth  . Cholesteatoma excision      [facility], with some resiudal loss of hearing  . Left oophorectomy  2013    secondary to granulosa cell tumor       The following portions of the patient's history were reviewed and updated as appropriate: Allergies, current medications, and problem list.    Review of Systems:   12 Pt  review of systems was negative except those addressed in the HPI,     History   Social History  . Marital Status: Married     Spouse Name: N/A    Number of Children: N/A  . Years of Education: N/A   Occupational History  . Not on file.   Social History Main Topics  . Smoking status: Never Smoker   . Smokeless tobacco: Never Used  . Alcohol Use: Yes  . Drug Use: No  . Sexually Active: Not on file   Other Topics Concern  . Not on file   Social History Narrative  . No narrative on file    Objective:  BP 104/66  Pulse 94  Temp(Src) 98.2 F (36.8 C) (Oral)  Resp 16  Wt 174 lb 12 oz (79.266 kg)  BMI 28.22 kg/m2  SpO2 98%  LMP 09/02/2012  General appearance: alert, cooperative and appears stated age Ears: normal TM's and external ear canals both ears Throat: lips, mucosa, and tongue normal; teeth and gums normal Neck: no adenopathy, no carotid bruit, supple, symmetrical, trachea midline and thyroid not enlarged, symmetric, no tenderness/mass/nodules Back: symmetric, no curvature. ROM normal. No CVA tenderness. Lungs: clear to auscultation bilaterally Heart: regular rate and rhythm, S1, S2 normal, no murmur, click, rub or gallop Abdomen: soft, non-tender; bowel sounds normal; no masses,  no organomegaly Pulses: 2+ and symmetric Skin: Skin color, texture, turgor normal. No rashes or lesions Lymph nodes: Cervical, supraclavicular, and axillary nodes normal.  Assessment and Plan:  Attention and  concentration deficit She tolerated the initial Adderall dose but has been unable to remember to take the second dose. We'll change her to Ritalin LA during a 10 mg daily dose.  Viral URI with cough All symptoms have resolved except for a dry hacking cough. Tessalon 200 mg every 8 hours.   Updated Medication List Outpatient Encounter Prescriptions as of 09/09/2012  Medication Sig Dispense Refill  . AMITIZA 24 MCG capsule TAKE ONE CAPSULE BY MOUTH TWICE A DAY WITH A MEAL  60 capsule  1  . amphetamine-dextroamphetamine (ADDERALL) 10 MG tablet Take 1 tablet (10 mg total) by mouth 2 (two) times daily.  60  tablet  0  . cetirizine (ZYRTEC) 10 MG tablet Take 10 mg by mouth daily.      [name] docusate sodium (COLACE) 50 MG capsule Take by mouth 2 (two) times daily.      [name] FIBER FORMULA PO Take by mouth.      . Lactobacillus Rhamnosus, GG, (CULTURELLE) CAPS Take 1 capsule by mouth daily.  30 capsule  0  . Multiple Vitamin (MULTIVITAMIN) tablet Take 1 tablet by mouth daily.      [name] pyridOXINE (VITAMIN B-6) 100 MG tablet Take 100 mg by mouth daily.      . vitamin B-12 (CYANOCOBALAMIN) 100 MCG tablet Take 50 mcg by mouth daily.      [name] zolpidem (AMBIEN) 10 MG tablet TAKE 1 TABLET BY MOUTH EVERY NIGHT  30 tablet  3  . benzonatate (TESSALON) 200 MG capsule Take 1 capsule (200 mg total) by mouth 3 (three) times daily as needed for cough.  60 capsule  1  . methylphenidate (RITALIN LA) 10 MG 24 hr capsule Take 1 capsule (10 mg total) by mouth every morning.  30 capsule  0  . phentermine (ADIPEX-P) 37.5 MG tablet Take 0.5 tablets (18.75 mg total) by mouth 2 (two) times daily before a meal.  30 tablet  0  . [DISCONTINUED] benzonatate (TESSALON) 200 MG capsule Take 1 capsule (200 mg total) by mouth 3 (three) times daily as needed for cough.  20 capsule  0  . [DISCONTINUED] buPROPion (WELLBUTRIN XL) 300 MG 24 hr tablet TAKE 1 TABLET BY MOUTH EVERY DAY  30 tablet  0  . [DISCONTINUED] HYDROcodone-homatropine (HYCODAN) 5-1.5 MG/5ML syrup Take 5 mLs by mouth every 6 (six) hours as needed for cough.  120 mL  0  . [DISCONTINUED] levofloxacin (LEVAQUIN) 500 MG tablet Take 1 tablet (500 mg total) by mouth daily.  7 tablet  0  . [DISCONTINUED] predniSONE (STERAPRED UNI-PAK) 10 MG tablet 6 tablets on Day 1 , then reduce by 1 tablet daily until gone  21 tablet  0   No facility-administered encounter medications on file as of 09/09/2012.     No orders of the defined types were placed in this encounter.    No Follow-up on file.

## 2012-09-10 ENCOUNTER — Encounter: Payer: Self-pay | Admitting: Internal Medicine

## 2012-09-10 NOTE — Assessment & Plan Note (Signed)
All symptoms have resolved except for a dry hacking cough. Tessalon 200 mg every 8 hours.

## 2012-09-10 NOTE — Assessment & Plan Note (Signed)
She tolerated the initial Adderall dose but has been unable to remember to take the second dose. We'll change her to Ritalin LA during a 10 mg daily dose.

## 2012-09-23 ENCOUNTER — Other Ambulatory Visit: Payer: Self-pay | Admitting: Internal Medicine

## 2012-09-23 ENCOUNTER — Encounter: Payer: Self-pay | Admitting: Internal Medicine

## 2012-09-23 MED ORDER — METHYLPHENIDATE HCL ER (LA) 20 MG PO CP24: 20.0000 mg | ORAL_CAPSULE | ORAL | Status: DC

## 2012-10-03 ENCOUNTER — Other Ambulatory Visit: Payer: Self-pay | Admitting: Internal Medicine

## 2012-10-11 ENCOUNTER — Encounter: Payer: Self-pay | Admitting: Internal Medicine

## 2012-10-14 ENCOUNTER — Telehealth: Payer: Self-pay | Admitting: *Deleted

## 2012-10-14 NOTE — Telephone Encounter (Signed)
Called [phone] for Prior Authorization on the Amitiza 24 MCG, form is being faxed over now  Patient Id: [id number]

## 2012-10-21 ENCOUNTER — Telehealth: Payer: Self-pay | Admitting: *Deleted

## 2012-10-21 NOTE — Telephone Encounter (Signed)
Received a fax from [place] [facility] Amitiza has been APPROVED

## 2012-11-01 ENCOUNTER — Other Ambulatory Visit: Payer: Self-pay | Admitting: Internal Medicine

## 2012-11-01 DIAGNOSIS — E663 Overweight: Secondary | ICD-10-CM

## 2012-11-01 MED ORDER — PHENTERMINE HCL 37.5 MG PO TABS: 18.5000 mg | ORAL_TABLET | Freq: Two times a day (BID) | ORAL | Status: DC

## 2012-11-01 NOTE — Telephone Encounter (Signed)
Patient notified script ready to pick up

## 2012-11-01 NOTE — Progress Notes (Signed)
Left message on voicemail for patient to call office. 

## 2012-11-01 NOTE — Telephone Encounter (Signed)
May patient try phentermine again please advise.

## 2012-11-14 ENCOUNTER — Other Ambulatory Visit: Payer: Self-pay | Admitting: Internal Medicine

## 2012-11-21 ENCOUNTER — Encounter: Payer: Self-pay | Admitting: Internal Medicine

## 2012-11-22 ENCOUNTER — Encounter: Payer: Self-pay | Admitting: Internal Medicine

## 2012-11-24 ENCOUNTER — Other Ambulatory Visit: Payer: Self-pay | Admitting: Internal Medicine

## 2012-11-24 MED ORDER — TRIAMCINOLONE ACETONIDE 0.1 % EX CREA: TOPICAL_CREAM | Freq: Two times a day (BID) | CUTANEOUS | Status: DC

## 2012-11-24 MED ORDER — PREDNISONE (PAK) 10 MG PO TABS: ORAL_TABLET | ORAL | Status: DC

## 2012-12-03 ENCOUNTER — Telehealth: Payer: Self-pay | Admitting: Internal Medicine

## 2012-12-03 NOTE — Telephone Encounter (Signed)
Patient Information:  Caller Name: Felice  Phone: [phone]  Patient: Dawn [name], Dawn [name]  Gender: Female  DOB: 1975/11/01  Age: 37 Years  PCP: Duncan Dull (Adults only)  Pregnant: No  Office Follow Up:  Does the office need to follow up with this patient?: No  Instructions For The Office: N/A  RN Note:  Finished prednisone for poison oak 11/30/12.  States no longer spreading, but states it has not improved after prednisone.  Afebrile.  States it is severely itchy, preventing sleep.  Per poison oak protocol, emergent symptoms denied; advised appt today.  No appts available 12/03/12; appt scheduled 0815 12/04/12 with Dr. Darrick Huntsman.  krs/can  Symptoms  Reason For Call & Symptoms: poison oak  Reviewed Health History In EMR: Yes  Reviewed Medications In EMR: Yes  Reviewed Allergies In EMR: Yes  Reviewed Surgeries / Procedures: Yes  Date of Onset of Symptoms: 11/12/2012 OB / GYN:  LMP: Unknown  Guideline(s) Used:  Poison Ivy - Oak or [place]  Disposition Per Guideline:   See Today in Office  Reason For Disposition Reached:   Severe itching interferes with normal activities (e.g., work or school) or prevents sleep  Advice Given:  N/A  Patient Will Follow Care Advice:  YES  Appointment Scheduled:  12/04/2012 08:15:00 Appointment Scheduled Provider:  Duncan Dull (Adults only)

## 2012-12-03 NOTE — Telephone Encounter (Signed)
Appointment scheduled.

## 2012-12-04 ENCOUNTER — Encounter: Payer: Self-pay | Admitting: Internal Medicine

## 2012-12-04 ENCOUNTER — Ambulatory Visit (INDEPENDENT_AMBULATORY_CARE_PROVIDER_SITE_OTHER): Payer: 59 | Admitting: Internal Medicine

## 2012-12-04 VITALS — BP 128/76 | HR 99 | Temp 98.7°F | Resp 16 | Wt 178.0 lb

## 2012-12-04 DIAGNOSIS — L247 Irritant contact dermatitis due to plants, except food: Secondary | ICD-10-CM

## 2012-12-04 DIAGNOSIS — L255 Unspecified contact dermatitis due to plants, except food: Secondary | ICD-10-CM

## 2012-12-04 MED ORDER — PREDNISONE 10 MG PO TABS: ORAL_TABLET | ORAL | Status: DC

## 2012-12-04 NOTE — Progress Notes (Signed)
Patient ID: Dawn [name], female   DOB: 1976/01/10, 37 y.o.   MRN: [id number]  Patient Active Problem List   Diagnosis Date Noted  . Contact dermatitis and eczema due to plant 12/04/2012  . Laryngitis, acute 08/12/2012  . Viral URI with cough 08/12/2012  . Attention and concentration deficit 09/17/2011  . Ovarian cancer on left 09/17/2011  . Overweight (BMI 25.0-29.9)   . Plantar fasciitis   . granulosa cell tumor 06/20/2011    Subjective:  CC:   Chief Complaint  Patient presents with  . Acute Visit    Rash on abdomen, legs, arm, chest:    HPI:   Dawn Lawrenceis a 37 y.o. female who presents Itchy rash ,  Started after pulling a weed from the yard tha she identifies as poison sumac. [name]  Two weeks ago started on neck and arm,  By end of day it spread and kept spreading . Started the prednisone for spreading,  But not itching yet,  did't start itching until prednisone stopped.    Past Medical History  Diagnosis Date  . Overweight (BMI 25.0-29.9)   . Plantar fasciitis   . granulosa cell tumor Jan 2013    left ovary     Past Surgical History  Procedure Laterality Date  . Cesarean section  2004    breech birth  . Cholesteatoma excision      [facility], with some resiudal loss of hearing  . Left oophorectomy  2013    secondary to granulosa cell tumor       The following portions of the patient's history were reviewed and updated as appropriate: Allergies, current medications, and problem list.    Review of Systems:   12 Pt  review of systems was negative except those addressed in the HPI,     History   Social History  . Marital Status: Married    Spouse Name: N/A    Number of Children: N/A  . Years of Education: N/A   Occupational History  . Not on file.   Social History Main Topics  . Smoking status: Never Smoker   . Smokeless tobacco: Never Used  . Alcohol Use: Yes  . Drug Use: No  . Sexually Active: Not on file   Other Topics Concern  . Not on  file   Social History Narrative  . No narrative on file    Objective:  BP 128/76  Pulse 99  Temp(Src) 98.7 F (37.1 C) (Oral)  Resp 16  Wt 178 lb (80.74 kg)  BMI 28.74 kg/m2  SpO2 99%  LMP 11/22/2012  General appearance: alert, cooperative and appears stated age Ears: normal TM's and external ear canals both ears Throat: lips, mucosa, and tongue normal; teeth and gums normal Neck: no adenopathy, no carotid bruit, supple, symmetrical, trachea midline and thyroid not enlarged, symmetric, no tenderness/mass/nodules Back: symmetric, no curvature. ROM normal. No CVA tenderness. Lungs: clear to auscultation bilaterally Heart: regular rate and rhythm, S1, S2 normal, no murmur, click, rub or gallop Abdomen: soft, non-tender; bowel sounds normal; no masses,  no organomegaly Pulses: 2+ and symmetric Skin: scattered linear streaked vesicular rash Lymph nodes: Cervical, supraclavicular, and axillary nodes normal.  Assessment and Plan:  Contact dermatitis and eczema due to plant imporved with 6 day taper initially but she has more lesions cropping up and itching is significant.  She has no signs of cellulitis   12 day prednisone taper prescribed .  Reviewed possible sources of repeat contact.     Updated  Medication List Outpatient Encounter Prescriptions as of 12/04/2012  Medication Sig Dispense Refill  . AMITIZA 24 MCG capsule TAKE 1 CAPSULE BY MOUTH TWICE DAILY WITH MEALS  60 capsule  1  . cetirizine (ZYRTEC) 10 MG tablet Take 10 mg by mouth daily.      [name] docusate sodium (COLACE) 50 MG capsule Take by mouth 2 (two) times daily.      [name] FIBER FORMULA PO Take by mouth.      . predniSONE (STERAPRED UNI-PAK) 10 MG tablet 6 tablets on Day 1 , then reduce by 1 tablet daily until gone  21 tablet  0  . pyridOXINE (VITAMIN B-6) 100 MG tablet Take 100 mg by mouth daily.      [name] triamcinolone cream (KENALOG) 0.1 % Apply topically 2 (two) times daily.  30 g  0  . vitamin B-12 (CYANOCOBALAMIN) 100  MCG tablet Take 50 mcg by mouth daily.      [name] zolpidem (AMBIEN) 10 MG tablet TAKE 1 TABLET BY MOUTH EVERY NIGHT  30 tablet  3  . benzonatate (TESSALON) 200 MG capsule Take 1 capsule (200 mg total) by mouth 3 (three) times daily as needed for cough.  60 capsule  1  . Lactobacillus Rhamnosus, GG, (CULTURELLE) CAPS Take 1 capsule by mouth daily.  30 capsule  0  . Multiple Vitamin (MULTIVITAMIN) tablet Take 1 tablet by mouth daily.      . phentermine (ADIPEX-P) 37.5 MG tablet Take 0.5 tablets (18.75 mg total) by mouth 2 (two) times daily before a meal.  30 tablet  0  . predniSONE (DELTASONE) 10 MG tablet 6 tablets daily for 3 days,  Then 4 tablets daily for 3 days, then 2 tablets daily for 3 days them 1 tablet daily for 3 days, then stop  39 tablet  0   No facility-administered encounter medications on file as of 12/04/2012.     No orders of the defined types were placed in this encounter.    No Follow-up on file.

## 2012-12-04 NOTE — Assessment & Plan Note (Addendum)
imporved with 6 day taper initially but she has more lesions cropping up and itching is significant.  She has no signs of cellulitis   12 day prednisone taper prescribed .  Reviewed possible sources of repeat contact.

## 2012-12-04 NOTE — Patient Instructions (Addendum)
Try using benadryl cream or calamine lotion on the really irritated spots.  Prolonged prednisone taper  Over 12 days   60 mg x 3 days 40 ,g x 3 days  20 mg x 3 days  10 mg x 3 days

## 2012-12-06 ENCOUNTER — Encounter: Payer: Self-pay | Admitting: Internal Medicine

## 2013-04-04 ENCOUNTER — Other Ambulatory Visit: Payer: Self-pay | Admitting: Internal Medicine

## 2013-04-04 NOTE — Telephone Encounter (Signed)
Ok to refill,  Authorized in epic and printed  

## 2013-04-04 NOTE — Telephone Encounter (Signed)
Okay to refill? 

## 2013-04-04 NOTE — Telephone Encounter (Signed)
Pt notified Rx ready for pickup and will need to sign controlled substance agreement.

## 2013-04-07 ENCOUNTER — Encounter: Payer: Self-pay | Admitting: *Deleted

## 2013-04-24 ENCOUNTER — Other Ambulatory Visit: Payer: Self-pay

## 2013-05-01 ENCOUNTER — Encounter: Payer: Self-pay | Admitting: Internal Medicine

## 2013-05-29 ENCOUNTER — Other Ambulatory Visit: Payer: Self-pay | Admitting: Internal Medicine

## 2013-05-30 NOTE — Telephone Encounter (Signed)
Refill

## 2013-06-02 ENCOUNTER — Other Ambulatory Visit: Payer: Self-pay

## 2013-06-03 ENCOUNTER — Encounter: Payer: Self-pay | Admitting: Internal Medicine

## 2013-12-01 ENCOUNTER — Encounter: Payer: Self-pay | Admitting: Internal Medicine

## 2013-12-01 ENCOUNTER — Ambulatory Visit (INDEPENDENT_AMBULATORY_CARE_PROVIDER_SITE_OTHER): Payer: 59 | Admitting: Internal Medicine

## 2013-12-01 VITALS — BP 126/78 | HR 92 | Temp 98.7°F | Resp 16 | Ht 66.0 in | Wt 169.0 lb

## 2013-12-01 DIAGNOSIS — L247 Irritant contact dermatitis due to plants, except food: Secondary | ICD-10-CM

## 2013-12-01 DIAGNOSIS — L255 Unspecified contact dermatitis due to plants, except food: Secondary | ICD-10-CM

## 2013-12-01 DIAGNOSIS — K589 Irritable bowel syndrome without diarrhea: Secondary | ICD-10-CM

## 2013-12-01 MED ORDER — PANTOPRAZOLE SODIUM 40 MG PO TBEC: 40.0000 mg | DELAYED_RELEASE_TABLET | Freq: Every day | ORAL | Status: DC

## 2013-12-01 MED ORDER — DICYCLOMINE HCL 20 MG PO TABS: 20.0000 mg | ORAL_TABLET | Freq: Four times a day (QID) | ORAL | Status: DC

## 2013-12-01 NOTE — Progress Notes (Signed)
Patient ID: Dawn [name], female   DOB: Jul 10, 1975, 38 y.o.   MRN: [id number]  Patient Active Problem List   Diagnosis Date Noted  . Irritable bowel syndrome 12/02/2013  . Contact dermatitis and eczema due to plant 12/04/2012  . Laryngitis, acute 08/12/2012  . Viral URI with cough 08/12/2012  . Attention and concentration deficit 09/17/2011  . Ovarian cancer on left 09/17/2011  . Overweight (BMI 25.0-29.9)   . Plantar fasciitis   . granulosa cell tumor 06/20/2011    Subjective:  CC:   Chief Complaint  Patient presents with  . Acute Visit    Going through divorice that is stressful and thinks maybe this might becausing stomach pain.  . Abdominal Pain  . Rash    right arm, left leg small raised papule's    HPI:   Dawn [name] is a 38 y.o. female who presents for evaluation of Post prandial abdominal pain accompanied by loose stools. Symptoms are intermittent, aggravated by emotional stress.  Going through a bitter divorce.  No blood in stools,  No nausea or vomiting. No recent travel or use of antibiotics.    Past Medical History  Diagnosis Date  . Overweight (BMI 25.0-29.9)   . Plantar fasciitis   . granulosa cell tumor Jan 2013    left ovary     Past Surgical History  Procedure Laterality Date  . Cesarean section  2004    breech birth  . Cholesteatoma excision      [facility], with some resiudal loss of hearing  . Left oophorectomy  2013    secondary to granulosa cell tumor       The following portions of the patient's history were reviewed and updated as appropriate: Allergies, current medications, and problem list.    Review of Systems:   Patient denies headache, fevers, malaise, unintentional weight loss, skin rash, eye pain, sinus congestion and sinus pain, sore throat, dysphagia,  hemoptysis , cough, dyspnea, wheezing, chest pain, palpitations, orthopnea, edema, nausea, melena, diarrhea, constipation, flank pain, dysuria, hematuria, urinary  Frequency,  nocturia, numbness, tingling, seizures,  Focal weakness, Loss of consciousness,  Tremor, depression, and suicidal ideation.     History   Social History  . Marital Status: Married    Spouse Name: N/A    Number of Children: N/A  . Years of Education: N/A   Occupational History  . Not on file.   Social History Main Topics  . Smoking status: Never Smoker   . Smokeless tobacco: Never Used  . Alcohol Use: Yes  . Drug Use: No  . Sexual Activity: Not on file   Other Topics Concern  . Not on file   Social History Narrative  . No narrative on file    Objective:  Filed Vitals:   12/01/13 1824  BP: 126/78  Pulse: 92  Temp: 98.7 F (37.1 C)  Resp: 16     General appearance: alert, cooperative and appears stated age Ears: normal TM's and external ear canals both ears Throat: lips, mucosa, and tongue normal; teeth and gums normal Neck: no adenopathy, no carotid bruit, supple, symmetrical, trachea midline and thyroid not enlarged, symmetric, no tenderness/mass/nodules Back: symmetric, no curvature. ROM normal. No CVA tenderness. Lungs: clear to auscultation bilaterally Heart: regular rate and rhythm, S1, S2 normal, no murmur, click, rub or gallop Abdomen: soft, non-tender; bowel sounds normal; no masses,  no organomegaly Pulses: 2+ and symmetric Skin: Skin color, texture, turgor normal. No rashes or lesions Lymph nodes: Cervical, supraclavicular, and axillary  nodes normal.  Assessment and Plan:  Irritable bowel syndrome Trial of dicyclomine , daily PPI   Updated Medication List Outpatient Encounter Prescriptions as of 12/01/2013  Medication Sig  . cetirizine (ZYRTEC) 10 MG tablet Take 10 mg by mouth daily.  [name] docusate sodium (COLACE) 50 MG capsule Take by mouth 2 (two) times daily.  [name] FIBER FORMULA PO Take by mouth.  Glennie Hawk 0.1-20 MG-MCG tablet Take 1 tablet by mouth daily.  . Multiple Vitamin (MULTIVITAMIN) tablet Take 1 tablet by mouth daily.  [name] zolpidem (AMBIEN)  10 MG tablet TAKE 1 TABLET BY MOUTH ONCE DAILY AT BEDTIME  . dicyclomine (BENTYL) 20 MG tablet Take 1 tablet (20 mg total) by mouth every 6 (six) hours.  . pantoprazole (PROTONIX) 40 MG tablet Take 1 tablet (40 mg total) by mouth daily.  . [DISCONTINUED] AMITIZA 24 MCG capsule TAKE 1 CAPSULE BY MOUTH TWICE DAILY WITH MEALS  . [DISCONTINUED] benzonatate (TESSALON) 200 MG capsule Take 1 capsule (200 mg total) by mouth 3 (three) times daily as needed for cough.  . [DISCONTINUED] Lactobacillus Rhamnosus, GG, (CULTURELLE) CAPS Take 1 capsule by mouth daily.  . [DISCONTINUED] phentermine (ADIPEX-P) 37.5 MG tablet Take 0.5 tablets (18.75 mg total) by mouth 2 (two) times daily before a meal.  . [DISCONTINUED] predniSONE (DELTASONE) 10 MG tablet 6 tablets daily for 3 days,  Then 4 tablets daily for 3 days, then 2 tablets daily for 3 days them 1 tablet daily for 3 days, then stop  . [DISCONTINUED] predniSONE (STERAPRED UNI-PAK) 10 MG tablet 6 tablets on Day 1 , then reduce by 1 tablet daily until gone  . [DISCONTINUED] pyridOXINE (VITAMIN B-6) 100 MG tablet Take 100 mg by mouth daily.  . [DISCONTINUED] triamcinolone cream (KENALOG) 0.1 % Apply topically 2 (two) times daily.  . [DISCONTINUED] vitamin B-12 (CYANOCOBALAMIN) 100 MCG tablet Take 50 mcg by mouth daily.     No orders of the defined types were placed in this encounter.    No Follow-up on file.

## 2013-12-01 NOTE — Patient Instructions (Addendum)
I am starting you on protonix, a PPI to take once daily in the morning, on an empty stomach.  The dicyclomine  Is an anti spasmodic.  Take it 20 minutes before meals to prevent pain and diarrhea.   If no improvement,  Referral to GI for endoscopy  Irritable Bowel Syndrome Irritable Bowel Syndrome (IBS) is caused by a disturbance of normal bowel function. Other terms used are spastic colon, mucous colitis, and irritable colon. It does not require surgery, nor does it lead to cancer. There is no cure for IBS. But with proper diet, stress reduction, and medication, you will find that your problems (symptoms) will gradually disappear or improve. IBS is a common digestive disorder. It usually appears in late adolescence or early adulthood. Women develop it twice as often as men. CAUSES  After food has been digested and absorbed in the small intestine, waste material is moved into the colon (large intestine). In the colon, water and salts are absorbed from the undigested products coming from the small intestine. The remaining residue, or fecal material, is held for elimination. Under normal circumstances, gentle, rhythmic contractions on the bowel walls push the fecal material along the colon towards the rectum. In IBS, however, these contractions are irregular and poorly coordinated. The fecal material is either retained too long, resulting in constipation, or expelled too soon, producing diarrhea. SYMPTOMS  The most common symptom of IBS is pain. It is typically in the lower left side of the belly (abdomen). But it may occur anywhere in the abdomen. It can be felt as heartburn, backache, or even as a dull pain in the arms or shoulders. The pain comes from excessive bowel-muscle spasms and from the buildup of gas and fecal material in the colon. This pain:  Can range from sharp belly (abdominal) cramps to a dull, continuous ache.  Usually worsens soon after eating.  Is typically relieved by having a  bowel movement or passing gas. Abdominal pain is usually accompanied by constipation. But it may also produce diarrhea. The diarrhea typically occurs right after a meal or upon arising in the morning. The stools are typically soft and watery. They are often flecked with secretions (mucus). Other symptoms of IBS include:  Bloating.  Loss of appetite.  Heartburn.  Feeling sick to your stomach (nausea).  Belching  Vomiting  Gas. IBS may also cause a number of symptoms that are unrelated to the digestive system:  Fatigue.  Headaches.  Anxiety  Shortness of breath  Difficulty in concentrating.  Dizziness. These symptoms tend to come and go. DIAGNOSIS  The symptoms of IBS closely mimic the symptoms of other, more serious digestive disorders. So your caregiver may wish to perform a variety of additional tests to exclude these disorders. He/she wants to be certain of learning what is wrong (diagnosis). The nature and purpose of each test will be explained to you. TREATMENT A number of medications are available to help correct bowel function and/or relieve bowel spasms and abdominal pain. Among the drugs available are:  Mild, non-irritating laxatives for severe constipation and to help restore normal bowel habits.  Specific anti-diarrheal medications to treat severe or prolonged diarrhea.  Anti-spasmodic agents to relieve intestinal cramps.  Your caregiver may also decide to treat you with a mild tranquilizer or sedative during unusually stressful periods in your life. The important thing to remember is that if any drug is prescribed for you, make sure that you take it exactly as directed. Make sure that your caregiver  knows how well it worked for you. HOME CARE INSTRUCTIONS   Avoid foods that are high in fat or oils. Some examples DUP:BDHDI cream, butter, frankfurters, sausage, and other fatty meats.  Avoid foods that have a laxative effect, such as fruit, fruit juice, and  dairy products.  Cut out carbonated drinks, chewing gum, and "gassy" foods, such as beans and cabbage. This may help relieve bloating and belching.  Bran taken with plenty of liquids may help relieve constipation.  Keep track of what foods seem to trigger your symptoms.  Avoid emotionally charged situations or circumstances that produce anxiety.  Start or continue exercising.  Get plenty of rest and sleep. MAKE SURE YOU:   Understand these instructions.  Will watch your condition.  Will get help right away if you are not doing well or get worse. Document Released: 06/05/2005 Document Revised: 08/28/2011 Document Reviewed: 01/24/2008 [facility] Patient Information 2014 [place].

## 2013-12-01 NOTE — Progress Notes (Signed)
Pre-visit discussion using our clinic review tool. No additional management support is needed unless otherwise documented below in the visit note.  

## 2013-12-02 DIAGNOSIS — K589 Irritable bowel syndrome without diarrhea: Secondary | ICD-10-CM | POA: Insufficient documentation

## 2013-12-02 NOTE — Assessment & Plan Note (Signed)
Very mild , recommend hydrocortisone topically

## 2013-12-02 NOTE — Assessment & Plan Note (Signed)
Trial of dicyclomine , daily PPI

## 2014-01-31 ENCOUNTER — Other Ambulatory Visit: Payer: Self-pay | Admitting: Internal Medicine

## 2014-02-02 NOTE — Telephone Encounter (Signed)
Last visit 12/01/13

## 2014-02-04 NOTE — Telephone Encounter (Signed)
Ok to refill,  printed rx  

## 2014-02-04 NOTE — Telephone Encounter (Signed)
Faxed to pharmacy

## 2014-02-19 ENCOUNTER — Ambulatory Visit (INDEPENDENT_AMBULATORY_CARE_PROVIDER_SITE_OTHER): Payer: 59 | Admitting: Internal Medicine

## 2014-02-19 ENCOUNTER — Ambulatory Visit: Payer: 59 | Admitting: Internal Medicine

## 2014-02-19 ENCOUNTER — Encounter: Payer: Self-pay | Admitting: Internal Medicine

## 2014-02-19 ENCOUNTER — Ambulatory Visit: Payer: 59 | Admitting: Adult Health

## 2014-02-19 VITALS — BP 112/70 | HR 70 | Temp 98.4°F | Resp 16 | Ht 66.0 in | Wt 167.8 lb

## 2014-02-19 DIAGNOSIS — R51 Headache: Secondary | ICD-10-CM

## 2014-02-19 DIAGNOSIS — C801 Malignant (primary) neoplasm, unspecified: Secondary | ICD-10-CM

## 2014-02-19 DIAGNOSIS — R519 Headache, unspecified: Secondary | ICD-10-CM

## 2014-02-19 DIAGNOSIS — E663 Overweight: Secondary | ICD-10-CM

## 2014-02-19 MED ORDER — PREDNISONE (PAK) 10 MG PO TABS: ORAL_TABLET | ORAL | Status: DC

## 2014-02-19 MED ORDER — FEXOFENADINE HCL 180 MG PO TABS: 180.0000 mg | ORAL_TABLET | Freq: Every day | ORAL | Status: DC

## 2014-02-19 NOTE — Progress Notes (Signed)
Patient ID: Dawn [name], female   DOB: [birth year], 38 y.o.   MRN: [id number]   Patient Active Problem List   Diagnosis Date Noted  . Frontal headache 02/22/2014  . Irritable bowel syndrome 12/02/2013  . Ovarian cancer on left 09/17/2011  . Overweight (BMI 25.0-29.9)   . Plantar fasciitis   . granulosa cell tumor 06/20/2011    Subjective:  CC:   Chief Complaint  Patient presents with  . Headache    Increased BP at times with headache.    HPI:   Dawn [name] is a 38 y.o. female who presents for Recurrent headaches, occurring randomly  2-3 times per week  For the past 2 months.  Always in the  Frontal location .  Has some sinus congestion which is chronic and takes zyrtec prn which relieves the congestion, but she states that daily use of zyrtec makes her nose run constantly!!   No current signs of sinusitis other than headache and congestion. .  Environmental change: AC had not been functional at work until around June. , which is aound the time her sinuses started becoming continually congested.  Has chronic left ear drainage issues, and has a myringotomy tube,  Is seen 4 times yearly by ENT to check tube.   Doesn't think it is stress but is going through final stages of divorce.       Past Medical History  Diagnosis Date  . Overweight (BMI 25.0-29.9)   . Plantar fasciitis   . granulosa cell tumor Jan 2013    left ovary     Past Surgical History  Procedure Laterality Date  . Cesarean section  2004    breech birth  . Cholesteatoma excision      [facility], with some resiudal loss of hearing  . Left oophorectomy  2013    secondary to granulosa cell tumor       The following portions of the patient's history were reviewed and updated as appropriate: Allergies, current medications, and problem list.    Review of Systems:   Patient denies headache, fevers, malaise, unintentional weight loss, skin rash, eye pain, sinus congestion and sinus pain, sore throat,  dysphagia,  hemoptysis , cough, dyspnea, wheezing, chest pain, palpitations, orthopnea, edema, abdominal pain, nausea, melena, diarrhea, constipation, flank pain, dysuria, hematuria, urinary  Frequency, nocturia, numbness, tingling, seizures,  Focal weakness, Loss of consciousness,  Tremor, insomnia, depression, anxiety, and suicidal ideation.     History   Social History  . Marital Status: Married    Spouse Name: N/A    Number of Children: N/A  . Years of Education: N/A   Occupational History  . Not on file.   Social History Main Topics  . Smoking status: Never Smoker   . Smokeless tobacco: Never Used  . Alcohol Use: Yes  . Drug Use: No  . Sexual Activity: Not on file   Other Topics Concern  . Not on file   Social History Narrative  . No narrative on file    Objective:  Filed Vitals:   02/19/14 0933  BP: 112/70  Pulse: 70  Temp: 98.4 F (36.9 C)  Resp: 16     General appearance: alert, cooperative and appears stated age Ears: left myringotomy tube in good position.  normal TM's and external ear canals both ears Throat: lips, mucosa, and tongue normal; teeth and gums normal Neck: no adenopathy, no carotid bruit, supple, symmetrical, trachea midline and thyroid not enlarged, symmetric, no tenderness/mass/nodules Lungs: clear to auscultation  bilaterally Heart: regular rate and rhythm, S1, S2 normal, no murmur, click, rub or gallop Skin: Skin color, texture, turgor normal. No rashes or lesions Lymph nodes: Cervical, supraclavicular, and axillary nodes normal. Neuro: CNs 2-12 intact. DTRs 2+/4 in biceps, brachioradialis, patellars and achilles. Muscle strength 5/5 in upper and lower exremities. Fine resting tremor bilaterally both hands cerebellar function normal. Romberg negative.  No pronator drift.   Gait normal.   Assessment and Plan:  Overweight (BMI 25.0-29.9) I have congratulated her in her wt loss of 11 lbs this year and encouraged  Continued use of  low  glycemic index diet and regular exercise a minimum of 5 days per week.    granulosa cell tumor  She is S/p oophorectomy, left, with missing fallopian tube on the right. Path report was ganulosa cell, per Dr. Sabra Heck, and not likely to recur,    Frontal headache Chronic sinusitis vs chronic allergic rhinitis and Eustachian tube dysfunction.Tube is in place and TM is not enflamed    Given the absence of daily headaches and purulent drainage or TM refdness, will treat inflammation and rhinitis without antibiotics first. Prednisone taper, benadryl at night,  And allegra instead of zyrtec,     Updated Medication List Outpatient Encounter Prescriptions as of 02/19/2014  Medication Sig  . Biotin 5000 MCG CAPS Take 1 capsule by mouth daily.  . cetirizine (ZYRTEC) 10 MG tablet Take 10 mg by mouth daily.  Glennie Hawk 0.1-20 MG-MCG tablet Take 1 tablet by mouth daily.  . Multiple Vitamin (MULTIVITAMIN) tablet Take 1 tablet by mouth daily.  [name] zolpidem (AMBIEN) 10 MG tablet TAKE 1 TABLET BY MOUTH EVERY NIGHT AT BEDTIME  . dicyclomine (BENTYL) 20 MG tablet Take 1 tablet (20 mg total) by mouth every 6 (six) hours.  . docusate sodium (COLACE) 50 MG capsule Take by mouth 2 (two) times daily.  . fexofenadine (SM FEXOFENADINE HCL) 180 MG tablet Take 1 tablet (180 mg total) by mouth daily.  [name] FIBER FORMULA PO Take by mouth.  . pantoprazole (PROTONIX) 40 MG tablet Take 1 tablet (40 mg total) by mouth daily.  . predniSONE (STERAPRED UNI-PAK) 10 MG tablet 6 tablets on Day 1 , then reduce by 1 tablet daily until gone     No orders of the defined types were placed in this encounter.    No Follow-up on file.

## 2014-02-19 NOTE — Progress Notes (Signed)
Pre-visit discussion using our clinic review tool. No additional management support is needed unless otherwise documented below in the visit note.  

## 2014-02-19 NOTE — Patient Instructions (Signed)
Your headaches may be due to sinus congestion and eustachian tube dysfunction due to chronic allergic rhinitis .  I am pescribing a prednisone taper  To manage the  the inflammation in your sinuses.   I also advise use of the following OTC meds to help with your other symptoms.   Take generic OTC benadryl 25 mg at bedtime to help the drainage,  And allegra 180 mg daily in the morning   You can use  Sudafed PE  10 to 30 mg every 8 hours for the congestion, you may substitute Afrin nasal spray for the nighttime dose of sudafed PE  If needed to prevent insomnia.  flushes your sinuses twice daily with Simply Saline (do over the sink because if you do it right you will spit out globs of mucus)

## 2014-02-22 ENCOUNTER — Encounter: Payer: Self-pay | Admitting: Internal Medicine

## 2014-02-22 DIAGNOSIS — R51 Headache: Secondary | ICD-10-CM

## 2014-02-22 DIAGNOSIS — R519 Headache, unspecified: Secondary | ICD-10-CM | POA: Insufficient documentation

## 2014-02-22 NOTE — Assessment & Plan Note (Signed)
Chronic sinusitis vs chronic allergic rhinitis and Eustachian tube dysfunction.Tube is in place and TM is not enflamed    Given the absence of daily headaches and purulent drainage or TM refdness, will treat inflammation and rhinitis without antibiotics first. Prednisone taper, benadryl at night,  And allegra instead of zyrtec,

## 2014-02-22 NOTE — Assessment & Plan Note (Signed)
She is S/p oophorectomy, left, with missing fallopian tube on the right. Path report was ganulosa cell, per Dr. Sabra Heck, and not likely to recur,

## 2014-02-22 NOTE — Assessment & Plan Note (Signed)
I have congratulated her in her wt loss of 11 lbs this year and encouraged  Continued use of  low glycemic index diet and regular exercise a minimum of 5 days per week.

## 2014-03-13 ENCOUNTER — Ambulatory Visit: Payer: Self-pay | Admitting: Unknown Physician Specialty

## 2014-05-05 ENCOUNTER — Ambulatory Visit: Payer: 59 | Admitting: Internal Medicine

## 2014-06-19 HISTORY — PX: ABDOMINAL HYSTERECTOMY: SHX81

## 2014-07-15 ENCOUNTER — Ambulatory Visit: Payer: Self-pay | Admitting: Obstetrics & Gynecology

## 2014-07-15 LAB — CBC WITH DIFFERENTIAL/PLATELET
Basophil #: 0 10*3/uL (ref 0.0–0.1)
Basophil %: 0.6 %
EOS ABS: 0 10*3/uL (ref 0.0–0.7)
Eosinophil %: 0.6 %
HCT: 41.2 % (ref 35.0–47.0)
HGB: 13.6 g/dL (ref 12.0–16.0)
Lymphocyte #: 1.6 10*3/uL (ref 1.0–3.6)
Lymphocyte %: 24.5 %
MCH: 29.5 pg (ref 26.0–34.0)
MCHC: 33.1 g/dL (ref 32.0–36.0)
MCV: 89 fL (ref 80–100)
MONO ABS: 0.4 x10 3/mm (ref 0.2–0.9)
Monocyte %: 5.7 %
NEUTROS PCT: 68.6 %
Neutrophil #: 4.5 10*3/uL (ref 1.4–6.5)
PLATELETS: 338 10*3/uL (ref 150–440)
RBC: 4.62 10*6/uL (ref 3.80–5.20)
RDW: 12.9 % (ref 11.5–14.5)
WBC: 6.5 10*3/uL (ref 3.6–11.0)

## 2014-07-15 LAB — BASIC METABOLIC PANEL
Anion Gap: 10 (ref 7–16)
BUN: 14 mg/dL (ref 7–18)
CHLORIDE: 104 mmol/L (ref 98–107)
CO2: 25 mmol/L (ref 21–32)
Calcium, Total: 8.9 mg/dL (ref 8.5–10.1)
Creatinine: 0.65 mg/dL (ref 0.60–1.30)
EGFR (African American): >60
EGFR (Non-African Amer.): >60
GLUCOSE: 80 mg/dL (ref 65–99)
Osmolality: 277 (ref 275–301)
Potassium: 4.4 mmol/L (ref 3.5–5.1)
SODIUM: 139 mmol/L (ref 136–145)

## 2014-07-23 ENCOUNTER — Ambulatory Visit: Payer: Self-pay | Admitting: Obstetrics & Gynecology

## 2014-07-25 ENCOUNTER — Other Ambulatory Visit: Payer: Self-pay | Admitting: Internal Medicine

## 2014-09-20 ENCOUNTER — Encounter: Payer: Self-pay | Admitting: Internal Medicine

## 2014-09-21 NOTE — Telephone Encounter (Signed)
Last visit 02/19/14, see mychart

## 2014-09-22 MED ORDER — ZOLPIDEM TARTRATE 10 MG PO TABS: 10.0000 mg | ORAL_TABLET | Freq: Every day | ORAL | Status: DC

## 2014-09-22 NOTE — Telephone Encounter (Signed)
Ok to refill,  printed rx  

## 2014-09-22 NOTE — Telephone Encounter (Signed)
Rx phoned into pharmacy.

## 2014-10-11 ENCOUNTER — Other Ambulatory Visit: Payer: Self-pay | Admitting: Internal Medicine

## 2014-10-11 NOTE — Op Note (Signed)
PATIENT NAME:  Dawn [name], Dawn [name] MR#:  [id number] DATE OF BIRTH:  12/27/1975  DATE OF PROCEDURE:  06/30/2011  PREOPERATIVE DIAGNOSIS: Pelvic mass, ovarian cyst.    POSTOPERATIVE DIAGNOSIS: Left pelvic mass, ovarian cyst.   PROCEDURES PERFORMED:  1. Operative laparoscopy. 2. Excision of left-sided pelvic mass, which includes most of ovary and partial fallopian tube. Chromopertubation with no spill on either side.   SURGEON: Glean Salen, M.D.   ASSISTANT: Donzetta Matters, M.D.   ANESTHESIA: General.   ESTIMATED BLOOD LOSS: 50 mL.  COMPLICATIONS: None.   FINDINGS: There was a left-sided large cystic mass. Approximately 1,200 mL of fluid was aspirated from the cyst. There was a normal right ovary although it was adherent with the appendix, in close proximity to the right abdominal sidewall. There was an absence of the right fallopian tube. The left fallopian tube was twisted and attenuated around and within the pelvic mass.   SPECIMEN: Left tube and ovary partial.   DISPOSITION: To the recovery room in stable condition.   TECHNIQUE: The patient is prepped and draped in the usual sterile fashion after adequate anesthesia is obtained in the dorsal lithotomy position. Foley catheter is inserted. A uterine manipulator is placed for manipulation purposes as well as to inject dye for chromopertubation.   Attention is then turned to the abdomen where a Veress needle is inserted through a 5 mm infraumbilical incision after Marcaine is used to anesthetize the skin. Veress needle placement is confirmed using the hanging drop technique and the abdomen is then insufflated with CO2 gas. A 5 mm trocar is then inserted under direct visualization with laparoscope with no injuries or bleeding noted. Placement is careful not to puncture the ovarian cyst. The cyst is indeed seen to be quite large and filling the entire pelvic cavity. After careful inspection, the patient is noted to have significant  endometriosis. The patient is also noted to have a normal right ovary and that the cyst is originating from the left ovary.   A left upper quadrant incision and 5 mm trocar is placed after visualization from the inside reveals no organs or adhesions in this area to obtain better visualization. A laparoscope is placed through this port. A right lower quadrant 11 mm trocar is placed as well for instrumentation purposes. The adhesion of the ovary to the sidewall along with endometriosis in this area is excised. A suction catheter is placed and is able to pierce the cyst and aspirate approximately 1,200 mL of fluid out of the cyst. Once it collapses down, there was better maneuverability within the pelvis.   The right ovary is identified and is dissected away from the appendix and the side wall is there in close proximity. At this point injection of the diode reveals no flow of dye and there is no visual fallopian tube on the right side. At this point, the ovarian cyst which is large and collapsed and the fallopian tube on this side is noted to be attenuated and coursing throughout the cyst and pelvic mass. It is excised with some ovarian tissue left in place. There is partial resection of the tube during this process. Excellent hemostasis is noted. The pelvic cavity is irrigated with fluid. Dye is injected again, but, again, no dye is seen to pass on either side. You can see dye through the tissues at the cornua as it is trying to find an exit, but there is no spillage of dye into the peritoneal cavity.   Inspection of  all operative surfaces reveals no injuries to ureter, bowel or blood vessels or of structures. Hemostasis is excellent. The pelvic mass that was excised is removed through an EndoCatch through the right lower quadrant incision. Gas is then expelled and trocars are removed. The rectus fascia in the right lower quadrant is closed with a 2-0 Vicryl suture. The skin is closed with Dermabond. Catheter  and manipulator are removed. The patient goes to the recovery room in stable condition. All sponge, instrument, and needle counts are correct.  ____________________________ R. Barnett Applebaum, MD rph:ap D: 06/30/2011 14:46:10 ET T: 06/30/2011 16:43:39 ET JOB#: [id number] cc: Glean Salen, MD, <Dictator> Gae Dry MD ELECTRONICALLY SIGNED 07/01/2011 23:06

## 2014-10-12 LAB — SURGICAL PATHOLOGY

## 2014-10-18 NOTE — Op Note (Signed)
PATIENT NAME:  Dawn [name], MCBRAYER MR#:  [id number] DATE OF BIRTH:  1975/06/20  DATE OF PROCEDURE:  07/23/2014  PREOPERATIVE DIAGNOSES:  1.  Pelvic pain.  2.  Endometriosis.  3.  Pelvic adhesions.   POSTOPERATIVE DIAGNOSES:  1.  Pelvic pain.  2.  Endometriosis.  3.  Pelvic adhesions.   PROCEDURES: Total laparoscopic hysterectomy, right salpingo-oophorectomy, lysis of adhesions, appendectomy.   SURGEON: Chinedu Agustin C. Balraj Brayfield, MD   ASSISTANT: Aletha Halim, MD    ANESTHESIA: General via endotracheal route.   OPERATIVE FLUIDS: 1500 mL of crystalloid.   URINE OUTPUT: 350 mL.   ESTIMATED BLOOD LOSS: 50 mL.   FINDINGS: Normal-appearing uterus, absent left ovary and tube, truncated right fallopian tube, normal-appearing right ovary was adherent to the pelvic sidewall, normal-appearing appendix with no adhesions to the ovary, normal-appearing upper abdomen, small but minimal endometrial implants on the anterior and right lateral abdominal wall.   SPECIMENS: 1.  Uterus, cervix, right tube, and  ovary.  2.  Appendix.   COMPLICATIONS: None apparent.   DISPOSITION: Stable to PACU for recovery.   INDICATION FOR PROCEDURE: Dawn [name] is a 39 year old female who had been previously diagnosed with a granulosa cell tumor of her left ovary after a cystectomy had been performed. Following this, another surgery was performed which removed both the left tube and ovary. At the time of these procedures, it was noted that the appendix was adherent to the right ovary. After this time, the patient had experienced extreme pain and discomfort with her menses, consistent with a history of endometriosis, and failed multiple regimens of medical management.  She had requested for a total hysterectomy and removal of her remaining ovary, and in counseling her it was noted that she would likely have to have her appendix removed at the same time as her ovary due to the aforementioned adhesions. The patient agreed.  Informed consent was obtained.   DESCRIPTION OF PROCEDURE: The patient was brought back to the Operating Room where she was placed on the table and given endotracheal intubation for general anesthesia. She was then placed in the dorsal lithotomy position in [place] and prepped and draped in the usual sterile fashion. A surgical timeout was called. A Graves speculum was placed into the vagina, which visualized the cervix and grasped at 12 o'clock with a single-tooth tenaculum. The uterus was sounded and then the medium-sized VCare uterine manipulator and the cervical cup were placed. A Foley catheter was also placed. The attention was then turned to the abdomen and after a change of gloves, an 11 blade was used to incise a 5 mm incision within the patient's umbilicus and a Veress needle was placed inside and a saline drop test was confirmatory that the Veress needle was in the abdominal cavity. A pneumoperitoneum was then created with an opening pressure of 7 mmHg. Once the pneumoperitoneum was created, the Veress needle was removed and then a 5 mm camera under Visiport was inserted using a 5 mm trocar. A second 5 mm trocar was placed in the right lower quadrant and an 11 mm trocar was placed in the left lower quadrant. Inspection of the upper abdomen and lower pelvis were then performed with findings as listed above. The cautery hook was then used to divide the peritoneum next to which the ovary had been adherent. The ovary was then dissected off the sidewall. The round ligament was divided using the LigaSure and dissection was performed in order to isolate the infundibulopelvic ligament on this  side. The ureter had been identified and was far from the surgical field. The infundibular pelvic ligament was then triply cauterized and then divided using the LigaSure, after which was found to be hemostatic. The anterior leaf of the broad ligament was then divided to the level of the cervical cup and the bladder  was lifted and dissected off this area without difficulty. The LigaSure was then used to divide the posterior leaf of the broad ligament. We skeletonized the uterine artery and then cauterized it. The attention was then turned to the left side, where the round ligament was divided using the LigaSure and the anterior broad ligament was connected with the previous incision in order to reflect the bladder anteriorly to create a bladder flap, and then the uterine vessels were sealed and divided using the LigaSure. The uterine vessels on the right side were then sealed and divided in the same manner. The cautery hook was then used to circumferentially divide the vaginal cuff at the level of the cervical cap without difficulty. The uterus and cervix, right tube and ovary were removed from the vagina. Allis clamps were then used to grasp the vaginal cuff and using a running locking stitch of 0 Vicryl, the vaginal cuff was closed. It was tested for integrity. The pneumoperitoneum was then re-created without difficulty.   Again, a change of gloves was performed and the attention was then turned to the appendix. Using the LigaSure, the mesoappendix was divided and then the appendiceal artery, inclusive of this tissue, was sealed and divided and found to be hemostatic. Once the broad base of the appendix was isolated, the Endo GIA stapler 45 mm with 2.5 mm staples was placed across the base and deployed without difficulty. The appendix was then placed into an Endo Catch bag and removed from the abdomen. There was some oozing from the peritoneum at the site where the ovary had been previously in place. The LigaSure was used to cauterize this area and then Surgiflo was placed over the bed to ensure hemostasis. The vaginal cuff was intact and hemostatic as well. At this time, the procedure was deemed to be complete. The pneumoperitoneum was deflated and the fascia of the 11 mm port was closed with a figure-of-eight of 0 Vicryl,  and then the skin sites were all closed using 4-0 Monocryl and then covered in surgical glue. The patient tolerated the procedure. The counts were correct x 2. The patient was brought back to PACU in a stable condition for recovery.     ____________________________ Honor Loh Lauralye Kinn, MD ccw:at D: 07/23/2014 14:28:24 ET T: 07/23/2014 18:33:21 ET JOB#: [id number]  cc: Vikki Ports C. Cariann Kinnamon, MD, <Dictator> Maceo Pro MD ELECTRONICALLY SIGNED 07/24/2014 23:47

## 2014-12-02 ENCOUNTER — Other Ambulatory Visit: Payer: Self-pay | Admitting: Internal Medicine

## 2015-04-25 ENCOUNTER — Other Ambulatory Visit: Payer: Self-pay | Admitting: Internal Medicine

## 2015-04-26 NOTE — Telephone Encounter (Signed)
See below

## 2015-04-26 NOTE — Telephone Encounter (Signed)
Refill denied.  Has not been seen in over a year. Needs appt.  Last one cancelled.

## 2015-04-30 ENCOUNTER — Other Ambulatory Visit: Payer: Self-pay

## 2015-04-30 ENCOUNTER — Telehealth: Payer: Self-pay

## 2015-04-30 NOTE — Telephone Encounter (Signed)
Refill denied.  Has not been seen since Sept 2015

## 2015-04-30 NOTE — Telephone Encounter (Signed)
Refill on Zolpidem

## 2015-05-17 ENCOUNTER — Telehealth: Payer: Self-pay | Admitting: Internal Medicine

## 2015-05-17 NOTE — Telephone Encounter (Signed)
Pt got married and just changed last name it was [facility]. Pt needs a refill for zolpidem (AMBIEN) 10 MG tablet. Pharmacy is [id number] - [place], [place]. Thank You!

## 2015-05-17 NOTE — Telephone Encounter (Signed)
No refills without an office visit,  She is  6 months overdue

## 2015-05-17 NOTE — Telephone Encounter (Signed)
Last appt was 04/2014 do you want to fill?

## 2015-05-19 ENCOUNTER — Encounter: Payer: Self-pay | Admitting: Internal Medicine

## 2015-05-19 NOTE — Telephone Encounter (Signed)
No answer or voicemail to leave a message. 

## 2015-05-24 ENCOUNTER — Ambulatory Visit (INDEPENDENT_AMBULATORY_CARE_PROVIDER_SITE_OTHER): Payer: 59 | Admitting: Internal Medicine

## 2015-05-24 ENCOUNTER — Encounter: Payer: Self-pay | Admitting: Internal Medicine

## 2015-05-24 VITALS — BP 116/80 | HR 66 | Temp 98.1°F | Resp 12 | Ht 66.0 in | Wt 171.0 lb

## 2015-05-24 DIAGNOSIS — Z90710 Acquired absence of both cervix and uterus: Secondary | ICD-10-CM | POA: Diagnosis not present

## 2015-05-24 DIAGNOSIS — R635 Abnormal weight gain: Secondary | ICD-10-CM | POA: Diagnosis not present

## 2015-05-24 DIAGNOSIS — E785 Hyperlipidemia, unspecified: Secondary | ICD-10-CM

## 2015-05-24 DIAGNOSIS — F5104 Psychophysiologic insomnia: Secondary | ICD-10-CM

## 2015-05-24 DIAGNOSIS — G4700 Insomnia, unspecified: Secondary | ICD-10-CM

## 2015-05-24 DIAGNOSIS — Z1159 Encounter for screening for other viral diseases: Secondary | ICD-10-CM

## 2015-05-24 MED ORDER — ZOLPIDEM TARTRATE 10 MG PO TABS: 10.0000 mg | ORAL_TABLET | Freq: Every evening | ORAL | Status: DC | PRN

## 2015-05-24 NOTE — Patient Instructions (Addendum)
I am refilling your generic Ambien for 6 months.  You will need to return in June for additional refills   If you would like to try an alternative medication , we could try the medication for restless legs to see if it helps the "jerking"

## 2015-05-24 NOTE — Progress Notes (Signed)
Pre-visit discussion using our clinic review tool. No additional management support is needed unless otherwise documented below in the visit note.  

## 2015-05-24 NOTE — Progress Notes (Signed)
Subjective:  Patient ID: Dawn [name], female    DOB: 08-21-1975  Age: 39 y.o. MRN: [id number]  CC: The primary encounter diagnosis was S/P total abdominal hysterectomy. Diagnoses of Need for hepatitis C screening test, Hyperlipidemia, Weight gain, and Chronic insomnia were also pertinent to this visit.  HPI Dawn [name] presents for follow up on chronic insomnia, ambien dependent.  She continues to have difficulty initiating sleep  And even with the ambien   Will sleep 7 hours max. Tried lunesta no change.  Prior Sleep study was done and noted frequent jerking motions of legs which disrupted her sleep .  No apneic events.   Outpatient Prescriptions Prior to Visit  Medication Sig Dispense Refill  . Biotin 5000 MCG CAPS Take 1 capsule by mouth daily.    . fexofenadine (ALLEGRA) 180 MG tablet TAKE 1 TABLET BY MOUTH EVERY DAY 30 tablet 5  . Multiple Vitamin (MULTIVITAMIN) tablet Take 1 tablet by mouth daily.    [name] zolpidem (AMBIEN) 10 MG tablet Take 1 tablet (10 mg total) by mouth at bedtime. 30 tablet 5  . FIBER FORMULA PO Take by mouth.    . cetirizine (ZYRTEC) 10 MG tablet Take 10 mg by mouth daily.    [name] dicyclomine (BENTYL) 20 MG tablet Take 1 tablet (20 mg total) by mouth every 6 (six) hours. 120 tablet 2  . docusate sodium (COLACE) 50 MG capsule Take by mouth 2 (two) times daily.    Glennie Hawk 0.1-20 MG-MCG tablet Take 1 tablet by mouth daily.    . pantoprazole (PROTONIX) 40 MG tablet Take 1 tablet (40 mg total) by mouth daily. (Patient not taking: Reported on 05/24/2015) 30 tablet 3  . predniSONE (STERAPRED UNI-PAK) 10 MG tablet 6 tablets on Day 1 , then reduce by 1 tablet daily until gone 21 tablet 0   No facility-administered medications prior to visit.    Review of Systems;  Patient denies headache, fevers, malaise, unintentional weight loss, skin rash, eye pain, sinus congestion and sinus pain, sore throat, dysphagia,  hemoptysis , cough, dyspnea, wheezing, chest pain, palpitations,  orthopnea, edema, abdominal pain, nausea, melena, diarrhea, constipation, flank pain, dysuria, hematuria, urinary  Frequency, nocturia, numbness, tingling, seizures,  Focal weakness, Loss of consciousness,  Tremor, depression, anxiety, and suicidal ideation.      Objective:  BP 116/80 mmHg  Pulse 66  Temp(Src) 98.1 F (36.7 C) (Oral)  Resp 12  Ht 5\' 6"  (1.676 m)  Wt 171 lb (77.565 kg)  BMI 27.61 kg/m2  SpO2 98%  LMP 11/22/2012  BP Readings from Last 3 Encounters:  05/24/15 116/80  02/19/14 112/70  12/01/13 126/78    Wt Readings from Last 3 Encounters:  05/24/15 171 lb (77.565 kg)  02/19/14 167 lb 12 oz (76.091 kg)  12/01/13 169 lb (76.658 kg)    General appearance: alert, cooperative and appears stated age Ears: normal TM's and external ear canals both ears Throat: lips, mucosa, and tongue normal; teeth and gums normal Neck: no adenopathy, no carotid bruit, supple, symmetrical, trachea midline and thyroid not enlarged, symmetric, no tenderness/mass/nodules Back: symmetric, no curvature. ROM normal. No CVA tenderness. Lungs: clear to auscultation bilaterally Heart: regular rate and rhythm, S1, S2 normal, no murmur, click, rub or gallop Abdomen: soft, non-tender; bowel sounds normal; no masses,  no organomegaly Pulses: 2+ and symmetric Skin: Skin color, texture, turgor normal. No rashes or lesions Lymph nodes: Cervical, supraclavicular, and axillary nodes normal.  No results found for: HGBA1C  Lab  Results  Component Value Date   CREATININE 0.62 05/24/2015   CREATININE 0.65 07/15/2014   CREATININE 0.64 11/21/2011    Lab Results  Component Value Date   WBC 6.5 07/15/2014   HGB 13.6 07/15/2014   HCT 41.2 07/15/2014   PLT 338 07/15/2014   GLUCOSE 81 05/24/2015   CHOL 215* 05/24/2015   TRIG 160.0* 05/24/2015   HDL 73.50 05/24/2015   LDLDIRECT 130.4 11/21/2011   LDLCALC 109* 05/24/2015   ALT 27 05/24/2015   AST 22 05/24/2015   NA 138 05/24/2015   K 4.3  05/24/2015   CL 100 05/24/2015   CREATININE 0.62 05/24/2015   BUN 11 05/24/2015   CO2 27 05/24/2015   TSH 1.72 05/24/2015    No results found.  Assessment & Plan:   Problem List Items Addressed This Visit    Chronic insomnia    ambien dependent, with prior trials of luensta and melatonin. The risks and benefits of hypnotic use were discussed with patient today including excessive sedation leading to respiratory depression,  impaired thinking/driving, and addiction.  Patient was advised to avoid concurrent use with alcohol, to use medication only as needed and not to share with others  .   Also discussed the sleep study report of "jerkign",  With the offer to try requip as an alternative        S/P total abdominal hysterectomy - Primary    Other Visit Diagnoses    Need for hepatitis C screening test        Relevant Orders    Hepatitis C antibody (Completed)    Hyperlipidemia        Relevant Orders    Lipid panel (Completed)    Weight gain        Relevant Orders    Comprehensive metabolic panel (Completed)    TSH (Completed)       I have discontinued Ms. Wolke's cetirizine, docusate sodium, LUTERA, dicyclomine, pantoprazole, and predniSONE. I am also having her start on zolpidem. Additionally, I am having her maintain her multivitamin, FIBER FORMULA PO, Biotin, zolpidem, fexofenadine, Estradiol-Norethindrone Acet, vitamin A, zinc gluconate, Vitamin D3, pyridOXINE, and Magnesium Oxide -Mg Supplement.  Meds ordered this encounter  Medications  . Estradiol-Norethindrone Acet (LOPREEZA) 0.5-0.1 MG tablet    Sig: Take 1 tablet by mouth daily.  . vitamin A 10000 UNIT capsule    Sig: Take 10,000 Units by mouth daily.  [name] zinc gluconate 50 MG tablet    Sig: Take 50 mg by mouth daily.  . Cholecalciferol (VITAMIN D3) 1000 UNITS CAPS    Sig: Take 1 capsule by mouth daily.  [name] pyridOXINE (VITAMIN B-6) 100 MG tablet    Sig: Take 100 mg by mouth daily.  . Magnesium Oxide -Mg  Supplement 250 MG TABS    Sig: Take 1 tablet by mouth 2 (two) times daily.  [name] zolpidem (AMBIEN) 10 MG tablet    Sig: Take 1 tablet (10 mg total) by mouth at bedtime as needed for sleep.    Dispense:  30 tablet    Refill:  5    Medications Discontinued During This Encounter  Medication Reason  . cetirizine (ZYRTEC) 10 MG tablet Change in therapy  . dicyclomine (BENTYL) 20 MG tablet Patient Preference  . docusate sodium (COLACE) 50 MG capsule Patient Preference  . LUTERA 0.1-20 MG-MCG tablet Patient Discharge  . pantoprazole (PROTONIX) 40 MG tablet Error  . predniSONE (STERAPRED UNI-PAK) 10 MG tablet Completed Course    Follow-up: No Follow-up  on file.   Crecencio Mc, MD

## 2015-05-25 LAB — COMPREHENSIVE METABOLIC PANEL
ALBUMIN: 4.3 g/dL (ref 3.5–5.2)
ALT: 27 U/L (ref 0–35)
AST: 22 U/L (ref 0–37)
Alkaline Phosphatase: 77 U/L (ref 39–117)
BUN: 11 mg/dL (ref 6–23)
CALCIUM: 9.5 mg/dL (ref 8.4–10.5)
CHLORIDE: 100 meq/L (ref 96–112)
CO2: 27 mEq/L (ref 19–32)
CREATININE: 0.62 mg/dL (ref 0.40–1.20)
GFR: 114.81 mL/min (ref >60.00)
Glucose, Bld: 81 mg/dL (ref 70–99)
Potassium: 4.3 mEq/L (ref 3.5–5.1)
Sodium: 138 mEq/L (ref 135–145)
Total Bilirubin: 0.5 mg/dL (ref 0.2–1.2)
Total Protein: 7.2 g/dL (ref 6.0–8.3)

## 2015-05-25 LAB — LIPID PANEL
CHOLESTEROL: 215 mg/dL — AB (ref 0–200)
HDL: 73.5 mg/dL (ref >39.00)
LDL Cholesterol: 109 mg/dL — ABNORMAL HIGH (ref 0–99)
NonHDL: 141.1
Total CHOL/HDL Ratio: 3
Triglycerides: 160 mg/dL — ABNORMAL HIGH (ref 0.0–149.0)
VLDL: 32 mg/dL (ref 0.0–40.0)

## 2015-05-25 LAB — TSH: TSH: 1.72 u[IU]/mL (ref 0.35–4.50)

## 2015-05-26 DIAGNOSIS — F5104 Psychophysiologic insomnia: Secondary | ICD-10-CM | POA: Insufficient documentation

## 2015-05-26 LAB — HEPATITIS C ANTIBODY: HCV Ab: NEGATIVE

## 2015-05-26 NOTE — Assessment & Plan Note (Addendum)
ambien dependent, with prior trials of luensta and melatonin. The risks and benefits of hypnotic use were discussed with patient today including excessive sedation leading to respiratory depression,  impaired thinking/driving, and addiction.  Patient was advised to avoid concurrent use with alcohol, to use medication only as needed and not to share with others  .   Also discussed the sleep study report of "jerkign",  With the offer to try requip as an alternative

## 2015-05-27 ENCOUNTER — Encounter: Payer: Self-pay | Admitting: Internal Medicine

## 2015-05-27 ENCOUNTER — Telehealth: Payer: Self-pay | Admitting: Internal Medicine

## 2015-05-27 NOTE — Telephone Encounter (Signed)
Please advise 

## 2015-05-27 NOTE — Telephone Encounter (Signed)
Pt has the prescription. Please cancel request!! Thank you!

## 2015-05-27 NOTE — Telephone Encounter (Signed)
Pt called about her zolpidem (AMBIEN) 10 MG tablet medication. Pt states the medication was not called into the pharmacy. Can pt be called once it's called in pt is in town. Pharmacy is [id number] - [place], [place]. Thank You!

## 2015-06-16 NOTE — Telephone Encounter (Signed)
Mailed unread message to patient.  

## 2015-07-02 ENCOUNTER — Encounter: Payer: Self-pay | Admitting: Internal Medicine

## 2015-07-19 ENCOUNTER — Encounter: Payer: Self-pay | Admitting: Internal Medicine

## 2015-07-19 ENCOUNTER — Ambulatory Visit (INDEPENDENT_AMBULATORY_CARE_PROVIDER_SITE_OTHER): Payer: 59 | Admitting: Internal Medicine

## 2015-07-19 VITALS — BP 118/74 | HR 70 | Temp 98.3°F | Resp 12 | Ht 66.0 in | Wt 170.8 lb

## 2015-07-19 DIAGNOSIS — E663 Overweight: Secondary | ICD-10-CM | POA: Diagnosis not present

## 2015-07-19 MED ORDER — PHENTERMINE HCL 37.5 MG PO TABS: ORAL_TABLET | ORAL | Status: DC

## 2015-07-19 NOTE — Progress Notes (Signed)
Subjective:  Patient ID: Dawn [name], female    DOB: 11-Oct-1975  Age: 40 y.o. MRN: [id number]  CC: The encounter diagnosis was Overweight (BMI 25.0-29.9).  HPI Dawn [name] presents for trouble losing weight. She has had difficulty losing weight due to increased appetite and is requesting a trial of  Phentermine.  She is currenlty exercising 3 days per week and following a low fat diet. She is aware of the possible side effects and risks and understands that    The medication will be discontinued if she has not lost 5% of her body weight over the next 3 months, which , based on today's weight is 8.5 lbs.  She has purchased an Elliptical and a Bowflex..     Outpatient Prescriptions Prior to Visit  Medication Sig Dispense Refill  . Biotin 5000 MCG CAPS Take 1 capsule by mouth daily.    . Cholecalciferol (VITAMIN D3) 1000 UNITS CAPS Take 1 capsule by mouth daily.    . Estradiol-Norethindrone Acet (LOPREEZA) 0.5-0.1 MG tablet Take 1 tablet by mouth daily.    . fexofenadine (ALLEGRA) 180 MG tablet TAKE 1 TABLET BY MOUTH EVERY DAY 30 tablet 5  . FIBER FORMULA PO Take by mouth.    . Magnesium Oxide -Mg Supplement 250 MG TABS Take 1 tablet by mouth 2 (two) times daily.    . Multiple Vitamin (MULTIVITAMIN) tablet Take 1 tablet by mouth daily.    [name] pyridOXINE (VITAMIN B-6) 100 MG tablet Take 100 mg by mouth daily.    . vitamin A 10000 UNIT capsule Take 10,000 Units by mouth daily.    [name] zinc gluconate 50 MG tablet Take 50 mg by mouth daily.    [name] zolpidem (AMBIEN) 10 MG tablet Take 1 tablet (10 mg total) by mouth at bedtime. 30 tablet 5  . zolpidem (AMBIEN) 10 MG tablet Take 1 tablet (10 mg total) by mouth at bedtime as needed for sleep. 30 tablet 5   No facility-administered medications prior to visit.    Review of Systems;  Patient denies headache, fevers, malaise, unintentional weight loss, skin rash, eye pain, sinus congestion and sinus pain, sore throat, dysphagia,  hemoptysis , cough,  dyspnea, wheezing, chest pain, palpitations, orthopnea, edema, abdominal pain, nausea, melena, diarrhea, constipation, flank pain, dysuria, hematuria, urinary  Frequency, nocturia, numbness, tingling, seizures,  Focal weakness, Loss of consciousness,  Tremor, insomnia, depression, anxiety, and suicidal ideation.      Objective:  BP 118/74 mmHg  Pulse 70  Temp(Src) 98.3 F (36.8 C) (Oral)  Resp 12  Ht 5\' 6"  (1.676 m)  Wt 170 lb 12 oz (77.452 kg)  BMI 27.57 kg/m2  SpO2 96%  LMP 11/22/2012  BP Readings from Last 3 Encounters:  07/19/15 118/74  05/24/15 116/80  02/19/14 112/70    Wt Readings from Last 3 Encounters:  07/19/15 170 lb 12 oz (77.452 kg)  05/24/15 171 lb (77.565 kg)  02/19/14 167 lb 12 oz (76.091 kg)    General appearance: alert, cooperative and appears stated age Ears: normal TM's and external ear canals both ears Throat: lips, mucosa, and tongue normal; teeth and gums normal Neck: no adenopathy, no carotid bruit, supple, symmetrical, trachea midline and thyroid not enlarged, symmetric, no tenderness/mass/nodules Back: symmetric, no curvature. ROM normal. No CVA tenderness. Lungs: clear to auscultation bilaterally Heart: regular rate and rhythm, S1, S2 normal, no murmur, click, rub or gallop Abdomen: soft, non-tender; bowel sounds normal; no masses,  no organomegaly Pulses: 2+ and symmetric Skin:  Skin color, texture, turgor normal. No rashes or lesions Lymph nodes: Cervical, supraclavicular, and axillary nodes normal.  No results found for: HGBA1C  Lab Results  Component Value Date   CREATININE 0.62 05/24/2015   CREATININE 0.65 07/15/2014   CREATININE 0.64 11/21/2011    Lab Results  Component Value Date   WBC 6.5 07/15/2014   HGB 13.6 07/15/2014   HCT 41.2 07/15/2014   PLT 338 07/15/2014   GLUCOSE 81 05/24/2015   CHOL 215* 05/24/2015   TRIG 160.0* 05/24/2015   HDL 73.50 05/24/2015   LDLDIRECT 130.4 11/21/2011   LDLCALC 109* 05/24/2015   ALT 27  05/24/2015   AST 22 05/24/2015   NA 138 05/24/2015   K 4.3 05/24/2015   CL 100 05/24/2015   CREATININE 0.62 05/24/2015   BUN 11 05/24/2015   CO2 27 05/24/2015   TSH 1.72 05/24/2015    No results found.  Assessment & Plan:   Problem List Items Addressed This Visit    Overweight (BMI 25.0-29.9) - Primary    I have addressed  BMI and recommended wt loss of 10% of body weigh over the next 6 months using a low glycemic index diet and regular exercise a minimum of 5 days per week. She has had difficulty losing weight due to increased appetite and is requesting a trial of  Phentermine.  She is aware of the possible side effects and risks and understands that    The medication will be discontinued if she has not lost 5% of her body weight over the next 3 months, which , based on today's weight is  9 lbs.          I am having Dawn [name] start on phentermine. I am also having her maintain her multivitamin, FIBER FORMULA PO, Biotin, zolpidem, fexofenadine, Estradiol-Norethindrone Acet, vitamin A, zinc gluconate, Vitamin D3, pyridOXINE, Magnesium Oxide -Mg Supplement, and zolpidem.  Meds ordered this encounter  Medications  . phentermine (ADIPEX-P) 37.5 MG tablet    Sig: 1/2 tablet in the am and early afternoon    Dispense:  30 tablet    Refill:  2  A total of 25 minutes of face to face time was spent with patient more than half of which was spent in counselling about the above mentioned conditions  and coordination of care   There are no discontinued medications.  Follow-up: Return in about 3 months (around 10/17/2015).   Crecencio Mc, MD

## 2015-07-19 NOTE — Patient Instructions (Addendum)
  I have authorized the use of phentermine for 3 months  and a  return to see me in 3 months. Goal weight loss is 5% of today's weight by then or phentermine   8.5 L  Your goal weight for BMI < 25 Is  155 LBS

## 2015-07-19 NOTE — Progress Notes (Signed)
Pre-visit discussion using our clinic review tool. No additional management support is needed unless otherwise documented below in the visit note.  

## 2015-07-20 NOTE — Assessment & Plan Note (Signed)
I have addressed  BMI and recommended wt loss of 10% of body weigh over the next 6 months using a low glycemic index diet and regular exercise a minimum of 5 days per week. She has had difficulty losing weight due to increased appetite and is requesting a trial of  Phentermine.  She is aware of the possible side effects and risks and understands that    The medication will be discontinued if she has not lost 5% of her body weight over the next 3 months, which , based on today's weight is  9 lbs.

## 2015-10-04 ENCOUNTER — Telehealth: Payer: Self-pay | Admitting: Internal Medicine

## 2015-10-04 ENCOUNTER — Encounter: Payer: Self-pay | Admitting: Family Medicine

## 2015-10-04 ENCOUNTER — Ambulatory Visit (INDEPENDENT_AMBULATORY_CARE_PROVIDER_SITE_OTHER): Payer: 59 | Admitting: Family Medicine

## 2015-10-04 VITALS — BP 108/82 | HR 83 | Temp 97.8°F | Ht 66.0 in | Wt 163.5 lb

## 2015-10-04 DIAGNOSIS — N390 Urinary tract infection, site not specified: Secondary | ICD-10-CM | POA: Diagnosis not present

## 2015-10-04 LAB — POCT URINALYSIS DIPSTICK
Bilirubin, UA: NEGATIVE
Glucose, UA: NEGATIVE
Ketones, UA: NEGATIVE
Leukocytes, UA: NEGATIVE
Nitrite, UA: NEGATIVE
Protein, UA: 15
Spec Grav, UA: >=1.03
Urobilinogen, UA: 0.2
pH, UA: 6

## 2015-10-04 MED ORDER — CEPHALEXIN 500 MG PO CAPS: 500.0000 mg | ORAL_CAPSULE | Freq: Three times a day (TID) | ORAL | Status: DC

## 2015-10-04 NOTE — Progress Notes (Signed)
   Subjective:  Patient ID: Dawn [name], female    DOB: [birth year]  Age: 40 y.o. MRN: [id number]  CC: UTI  HPI:  40 year old female presents to clinic today for an acute visit with complaints of UTI.  UTI  Patient reports she developed frequency, urgency, and dysuria yesterday.  She's also noticed some blood with wiping.  No associated abdominal pain or flank pain.  No fevers or chills.  No known exacerbating or relieving factors.  No interventions tried.  No other complaints or associated symptoms today.  Social Hx   Social History   Social History  . Marital Status: Married    Spouse Name: N/A  . Number of Children: N/A  . Years of Education: N/A   Social History Main Topics  . Smoking status: Never Smoker   . Smokeless tobacco: Never Used  . Alcohol Use: Yes  . Drug Use: No  . Sexual Activity: Not Asked   Other Topics Concern  . None   Social History Narrative   Review of Systems  Constitutional: Negative for fever.  Gastrointestinal: Negative for abdominal pain.  Genitourinary: Positive for dysuria, urgency and frequency. Negative for flank pain.   Objective:  BP 108/82 mmHg  Pulse 83  Temp(Src) 97.8 F (36.6 C) (Oral)  Ht 5\' 6"  (1.676 m)  Wt 163 lb 8 oz (74.163 kg)  BMI 26.40 kg/m2  SpO2 98%  LMP 11/22/2012  BP/Weight 10/04/2015 07/19/2015 123XX123  Systolic BP 123XX123 [id number]  Diastolic BP 82 74 80  Wt. (Lbs) 163.5 170.75 171  BMI 26.4 27.57 27.61   Physical Exam  Constitutional: She is oriented to person, place, and time. She appears well-developed. No distress.  Cardiovascular: Normal rate and regular rhythm.   No murmur heard. Pulmonary/Chest: Effort normal and breath sounds normal.  Abdominal: Soft. She exhibits no distension. There is no tenderness. There is no rebound and no guarding.  Neurological: She is alert and oriented to person, place, and time.  Vitals reviewed.  Lab Results  Component Value Date   WBC 6.5 07/15/2014   HGB  13.6 07/15/2014   HCT 41.2 07/15/2014   PLT 338 07/15/2014   GLUCOSE 81 05/24/2015   CHOL 215* 05/24/2015   TRIG 160.0* 05/24/2015   HDL 73.50 05/24/2015   LDLDIRECT 130.4 11/21/2011   LDLCALC 109* 05/24/2015   ALT 27 05/24/2015   AST 22 05/24/2015   NA 138 05/24/2015   K 4.3 05/24/2015   CL 100 05/24/2015   CREATININE 0.62 05/24/2015   BUN 11 05/24/2015   CO2 27 05/24/2015   TSH 1.72 05/24/2015   Assessment & Plan:   Problem List Items Addressed This Visit    UTI (lower urinary tract infection) - Primary    New problem. Urinalysis with 1+ blood. Patient with classic symptoms of UTI. Treating empirically with Keflex while awaiting culture.      Relevant Medications   cephALEXin (KEFLEX) 500 MG capsule   Other Relevant Orders   POCT Urinalysis Dipstick (Completed)   Urine culture      Meds ordered this encounter  Medications  . cephALEXin (KEFLEX) 500 MG capsule    Sig: Take 1 capsule (500 mg total) by mouth 3 (three) times daily.    Dispense:  15 capsule    Refill:  0    Follow-up: PRN  [place]

## 2015-10-04 NOTE — Telephone Encounter (Signed)
[facility]  Patient Name: Dawn [name]  DOB: 03/04/1976    Initial Comment Caller States she would like to make an appointment she has a UTI   Nurse Assessment  Nurse: Wynetta Emery, RN, Baker Janus Date/Time (Eastern Time): 10/04/2015 7:21:22 AM  Confirm and document reason for call. If symptomatic, describe symptoms. You must click the next button to save text entered. ---[name] is having pain with urination and blood (several drops after finishing) frequency and urgency noted also onset yesterday  Has the patient traveled out of the country within the last 30 days? ---No  Does the patient have any new or worsening symptoms? ---Yes  Will a triage be completed? ---Yes  Related visit to physician within the last 2 weeks? ---No  Does the PT have any chronic conditions? (i.e. diabetes, asthma, etc.) ---No  Is the patient pregnant or possibly pregnant? (Ask all females between the ages of 85-55) ---No  Is this a behavioral health or substance abuse call? ---No     Guidelines    Guideline Title Affirmed Question Affirmed Notes  Urination Pain - Female All other patients with painful urination (Exception: [1] EITHER frequency or urgency AND [2] has on-call doctor)    Final Disposition User   See Physician within 24 Hours Wynetta Emery, RN, Baker Janus    Comments  NOTE Appt made with Thersa Salt, DO @ 10-04-2015 815am r/o UTI  NOTE: Dr. Derrel Nip had no available appts at this time. Patient preferred to have am appt so she can go on to work.   Referrals  REFERRED TO PCP OFFICE   Disagree/Comply: Comply

## 2015-10-04 NOTE — Patient Instructions (Signed)
It was nice to see you today.  Take the Keflex as prescribed while we await the culture results.  Follow up:  As needed  Take care  Dr. Lacinda Axon

## 2015-10-04 NOTE — Assessment & Plan Note (Signed)
New problem. Urinalysis with 1+ blood. Patient with classic symptoms of UTI. Treating empirically with Keflex while awaiting culture.

## 2015-10-04 NOTE — Progress Notes (Signed)
Pre visit review using our clinic review tool, if applicable. No additional management support is needed unless otherwise documented below in the visit note. 

## 2015-10-06 ENCOUNTER — Encounter: Payer: Self-pay | Admitting: Family Medicine

## 2015-10-06 LAB — URINE CULTURE

## 2015-10-18 ENCOUNTER — Ambulatory Visit: Payer: 59 | Admitting: Internal Medicine

## 2015-11-05 ENCOUNTER — Ambulatory Visit (INDEPENDENT_AMBULATORY_CARE_PROVIDER_SITE_OTHER): Payer: 59 | Admitting: Internal Medicine

## 2015-11-05 VITALS — BP 120/84 | HR 78 | Temp 98.1°F | Wt 154.8 lb

## 2015-11-05 DIAGNOSIS — E663 Overweight: Secondary | ICD-10-CM

## 2015-11-05 DIAGNOSIS — J321 Chronic frontal sinusitis: Secondary | ICD-10-CM

## 2015-11-05 MED ORDER — PHENTERMINE HCL 37.5 MG PO TABS: ORAL_TABLET | ORAL | Status: DC

## 2015-11-05 MED ORDER — MONTELUKAST SODIUM 10 MG PO TABS: 10.0000 mg | ORAL_TABLET | Freq: Every day | ORAL | Status: DC

## 2015-11-05 NOTE — Progress Notes (Signed)
Subjective:  Patient ID: Dawn [name], female    DOB: 1975-09-02  Age: 40 y.o. MRN: [id number]  CC: The primary encounter diagnosis was Overweight. Diagnoses of Overweight (BMI 25.0-29.9) and Chronic frontal sinusitis were also pertinent to this visit.  HPI Dawn [name] presents for weight loss management with phentermine.   Has lost 16 lbs since January obesity with recent initiation of phentermine for appetite suppression.  She is tolerating the medication without side effects .  She is taking the medication twice daily in a divided dose and  has lost a significant amount of weight in the last 3 months and in general is feeling better about herself.  Her insomnia is no worse, and managed with low dose alprazolam when needed. Want to lose 20 lbs more goal 134 last time she was 134 was 2000.  Saw ENT yesterday for left sinusitis   On biaxin and prednisone . Not on a probiotic     Outpatient Prescriptions Prior to Visit  Medication Sig Dispense Refill  . Biotin 5000 MCG CAPS Take 1 capsule by mouth daily.    . Cholecalciferol (VITAMIN D3) 1000 UNITS CAPS Take 1 capsule by mouth daily.    . Estradiol-Norethindrone Acet (LOPREEZA) 0.5-0.1 MG tablet Take 1 tablet by mouth daily.    . fexofenadine (ALLEGRA) 180 MG tablet TAKE 1 TABLET BY MOUTH EVERY DAY 30 tablet 5  . Magnesium Oxide -Mg Supplement 250 MG TABS Take 1 tablet by mouth 2 (two) times daily.    . Multiple Vitamin (MULTIVITAMIN) tablet Take 1 tablet by mouth daily.    [name] pyridOXINE (VITAMIN B-6) 100 MG tablet Take 100 mg by mouth daily.    . vitamin A 10000 UNIT capsule Take 10,000 Units by mouth daily.    [name] zinc gluconate 50 MG tablet Take 50 mg by mouth daily.    [name] zolpidem (AMBIEN) 10 MG tablet Take 1 tablet (10 mg total) by mouth at bedtime as needed for sleep. 30 tablet 5  . cephALEXin (KEFLEX) 500 MG capsule Take 1 capsule (500 mg total) by mouth 3 (three) times daily. 15 capsule 0  . FIBER FORMULA PO Take by mouth. Reported  on 10/04/2015    . phentermine (ADIPEX-P) 37.5 MG tablet 1/2 tablet in the am and early afternoon 30 tablet 2  . zolpidem (AMBIEN) 10 MG tablet Take 1 tablet (10 mg total) by mouth at bedtime. 30 tablet 5   No facility-administered medications prior to visit.    Review of Systems;  Patient denies headache, fevers, malaise, unintentional weight loss, skin rash, eye pain, sinus congestion and sinus pain, sore throat, dysphagia,  hemoptysis , cough, dyspnea, wheezing, chest pain, palpitations, orthopnea, edema, abdominal pain, nausea, melena, diarrhea, constipation, flank pain, dysuria, hematuria, urinary  Frequency, nocturia, numbness, tingling, seizures,  Focal weakness, Loss of consciousness,  Tremor, insomnia, depression, anxiety, and suicidal ideation.      Objective:  BP 120/84 mmHg  Pulse 78  Temp(Src) 98.1 F (36.7 C) (Oral)  Wt 154 lb 12.8 oz (70.217 kg)  LMP 11/22/2012  BP Readings from Last 3 Encounters:  11/05/15 120/84  10/04/15 108/82  07/19/15 118/74    Wt Readings from Last 3 Encounters:  11/05/15 154 lb 12.8 oz (70.217 kg)  10/04/15 163 lb 8 oz (74.163 kg)  07/19/15 170 lb 12 oz (77.452 kg)    General appearance: alert, cooperative and appears stated age Ears: normal TM's and external ear canals both ears Throat: lips, mucosa, and tongue  normal; teeth and gums normal Neck: no adenopathy, no carotid bruit, supple, symmetrical, trachea midline and thyroid not enlarged, symmetric, no tenderness/mass/nodules Back: symmetric, no curvature. ROM normal. No CVA tenderness. Lungs: clear to auscultation bilaterally Heart: regular rate and rhythm, S1, S2 normal, no murmur, click, rub or gallop Abdomen: soft, non-tender; bowel sounds normal; no masses,  no organomegaly Pulses: 2+ and symmetric Skin: Skin color, texture, turgor normal. No rashes or lesions Lymph nodes: Cervical, supraclavicular, and axillary nodes normal.  No results found for: HGBA1C  Lab Results    Component Value Date   CREATININE 0.62 05/24/2015   CREATININE 0.65 07/15/2014   CREATININE 0.64 11/21/2011    Lab Results  Component Value Date   WBC 6.5 07/15/2014   HGB 13.6 07/15/2014   HCT 41.2 07/15/2014   PLT 338 07/15/2014   GLUCOSE 81 05/24/2015   CHOL 215* 05/24/2015   TRIG 160.0* 05/24/2015   HDL 73.50 05/24/2015   LDLDIRECT 130.4 11/21/2011   LDLCALC 109* 05/24/2015   ALT 27 05/24/2015   AST 22 05/24/2015   NA 138 05/24/2015   K 4.3 05/24/2015   CL 100 05/24/2015   CREATININE 0.62 05/24/2015   BUN 11 05/24/2015   CO2 27 05/24/2015   TSH 1.72 05/24/2015    No results found.  Assessment & Plan:   Problem List Items Addressed This Visit    Overweight (BMI 25.0-29.9)    I have congratulated her in reduction of   BMI and encouraged  Continued weight loss with goal of  5% of body weight over the next 3 months using a low glycemic index diet , phentermine for appetite suppression, and regular exercise a minimum of 5 days per week.I have authorized the refill of phentermine for 3 months. Goal is 8 lbs wt loss       RESOLVED: Overweight - Primary   Sinusitis, chronic    Treated by ENT recently .  Adding probiotic today .       Relevant Medications   cephALEXin (KEFLEX) 500 MG capsule   methylPREDNISolone (MEDROL DOSEPAK) 4 MG TBPK tablet      I have discontinued [name]'s FIBER FORMULA PO. I am also having her start on montelukast. Additionally, I am having her maintain her multivitamin, Biotin, fexofenadine, Estradiol-Norethindrone Acet, vitamin A, zinc gluconate, Vitamin D3, pyridOXINE, Magnesium Oxide -Mg Supplement, zolpidem, cephALEXin, methylPREDNISolone, and phentermine.  Meds ordered this encounter  Medications  . cephALEXin (KEFLEX) 500 MG capsule    Sig: Take 500 mg by mouth 2 (two) times daily.    Refill:  0  . methylPREDNISolone (MEDROL DOSEPAK) 4 MG TBPK tablet    Sig: Take as directed  . montelukast (SINGULAIR) 10 MG tablet    Sig: Take  1 tablet (10 mg total) by mouth at bedtime.    Dispense:  30 tablet    Refill:  3  . phentermine (ADIPEX-P) 37.5 MG tablet    Sig: 1/2 tablet in the am and early afternoon    Dispense:  30 tablet    Refill:  2    Medications Discontinued During This Encounter  Medication Reason  . cephALEXin (KEFLEX) 500 MG capsule   . zolpidem (AMBIEN) 10 MG tablet Duplicate  . FIBER FORMULA PO   . phentermine (ADIPEX-P) 37.5 MG tablet Reorder    Follow-up: Return in about 3 months (around 02/05/2016).   Crecencio Mc, MD

## 2015-11-05 NOTE — Progress Notes (Signed)
Pre visit review using our clinic review tool, if applicable. No additional management support is needed unless otherwise documented below in the visit note. 

## 2015-11-05 NOTE — Patient Instructions (Addendum)
Adding Singulair to your allegra for control of allergies . Once daily at bedtime   Please take a probiotic ( Align, Flora que or [employer]), the generic version of one of these over the counter medications, or a PROBIOTIC BEVERAGE  (kombucha,  [place] ) Yogurt, or another dietary source) for a minimum of 3 weeks to prevent a serious antibiotic associated diarrhea  Called clostridium dificile colitis.  Taking a probiotic may also prevent vaginitis due to yeast infections and can be continued indefinitely if you feel that it improves your digestion or your elimination (bowels).   I have authorized the use of phentermine for 3 more months  and a  return to see me in 3 months. Goal weight loss is 5% of today's weight (7 lbs)

## 2015-11-06 ENCOUNTER — Encounter: Payer: Self-pay | Admitting: Internal Medicine

## 2015-11-06 DIAGNOSIS — E663 Overweight: Secondary | ICD-10-CM | POA: Insufficient documentation

## 2015-11-06 DIAGNOSIS — J329 Chronic sinusitis, unspecified: Secondary | ICD-10-CM | POA: Insufficient documentation

## 2015-11-06 NOTE — Assessment & Plan Note (Signed)
Treated by ENT recently .  Adding probiotic today .

## 2015-11-06 NOTE — Assessment & Plan Note (Addendum)
I have congratulated her in reduction of   BMI and encouraged  Continued weight loss with goal of  5% of body weight over the next 3 months using a low glycemic index diet , phentermine for appetite suppression, and regular exercise a minimum of 5 days per week.I have authorized the refill of phentermine for 3 months. Goal is 8 lbs wt loss

## 2015-12-20 ENCOUNTER — Other Ambulatory Visit: Payer: Self-pay | Admitting: Internal Medicine

## 2015-12-20 NOTE — Telephone Encounter (Signed)
Last OV 5/17 ok to fil ambien?

## 2015-12-21 NOTE — Telephone Encounter (Signed)
Ok to refill,  Authorized in epic 

## 2016-02-21 ENCOUNTER — Other Ambulatory Visit: Payer: Self-pay | Admitting: Internal Medicine

## 2016-03-26 ENCOUNTER — Telehealth: Payer: 59 | Admitting: Family

## 2016-03-26 DIAGNOSIS — N390 Urinary tract infection, site not specified: Secondary | ICD-10-CM

## 2016-03-26 MED ORDER — NITROFURANTOIN MONOHYD MACRO 100 MG PO CAPS: 100.0000 mg | ORAL_CAPSULE | Freq: Two times a day (BID) | ORAL | 0 refills | Status: DC

## 2016-03-26 NOTE — Progress Notes (Signed)

## 2016-05-10 ENCOUNTER — Telehealth: Payer: Self-pay | Admitting: Internal Medicine

## 2016-05-10 ENCOUNTER — Other Ambulatory Visit: Payer: Self-pay | Admitting: Internal Medicine

## 2016-05-10 ENCOUNTER — Telehealth: Payer: 59 | Admitting: Nurse Practitioner

## 2016-05-10 DIAGNOSIS — N3000 Acute cystitis without hematuria: Secondary | ICD-10-CM

## 2016-05-10 MED ORDER — CIPROFLOXACIN HCL 250 MG PO TABS: 250.0000 mg | ORAL_TABLET | Freq: Two times a day (BID) | ORAL | 0 refills | Status: DC

## 2016-05-10 NOTE — Telephone Encounter (Signed)
Pt called and asked if Dr. Derrel Nip would call her in a prescription for a UTI, urgency with urinating and burning. There are no available appts today. Please advise, thank you!  Pharmacy - [facility] [place], [place] - [address]  Call pt @ [phone] - may leave msg

## 2016-05-10 NOTE — Telephone Encounter (Signed)
Please triage

## 2016-05-10 NOTE — Telephone Encounter (Signed)
Called patient back offered for her to come leave urine specimen .  Patient states she is unable to due to work situation.  I explained to patient that we would need urine to see if she did have infection.  Patient states she would be unable to get here until 4:55.  I explained to patient that we would need her here prior to 500 so we could process the urine and get the results to provider.   I urged the patient to go to the urgent care she became very upset stating it cost more money for her.   She also states every time she calls to get an appointment here there are no available appointments at our office.   Patient states she was previously treated with e- visit.    I apologized to patient that we couldn't accommodate her needs.  She stated she wanted to speak with my manager as well as Dr Derrel Nip.

## 2016-05-10 NOTE — Progress Notes (Signed)
We are sorry that you are not feeling well.  Here is how we plan to help!  Based on what you shared with me it looks like you most likely have a simple urinary tract infection.  A UTI (Urinary Tract Infection) is a bacterial infection of the bladder.  Most cases of urinary tract infections are simple to treat but a key part of your care is to encourage you to drink plenty of fluids and watch your symptoms carefully.  LOOKS LIKE A DR> TULLO HAS SNET IN A CIPRO RX FOR YOU TODAY WHICH SHOULD TAKE CARE OF YOUR UTI.  Your symptoms should gradually improve. Call us if the burning in your urine worsens, you develop worsening fever, back pain or pelvic pain or if your symptoms do not resolve after completing the antibiotic.  Urinary tract infections can be prevented by drinking plenty of water to keep your body hydrated.  Also be sure when you wipe, wipe from front to back and don't hold it in!  If possible, empty your bladder every 4 hours.  Your e-visit answers were reviewed by a board certified advanced clinical practitioner to complete your personal care plan.  Depending on the condition, your plan could have included both over the counter or prescription medications.  If there is a problem please reply  once you have received a response from your provider.  Your safety is important to [place].  If you have drug allergies check your prescription carefully.    You can use MyChart to ask questions about today's visit, request a non-urgent call back, or ask for a work or school excuse for 24 hours related to this e-Visit. If it has been greater than 24 hours you will need to follow up with your provider, or enter a new e-Visit to address those concerns.   You will get an e-mail in the next two days asking about your experience.  I hope that your e-visit has been valuable and will speed your recovery. Thank you for using e-visits.

## 2016-05-10 NOTE — Telephone Encounter (Signed)
Called patient to notify that she needs to start cipro that was ordered by MD for UTI symptoms,  advised patient we need to setup an appointment to find out why she has had these on going UTI patient become angyr when my first available was Monday December 11th, started arguing that we never have an opening in this office when she needs it. So, I moved patient to December the 4th at 5.30  Patient became more angry when I tried to explain this appointment was tomake her happy that this appointment was normally used for acute problems not an on going problem but I was trying to work  With her and make her happy she accused me and the whole office of being rude.

## 2016-05-22 ENCOUNTER — Encounter: Payer: Self-pay | Admitting: Internal Medicine

## 2016-05-22 ENCOUNTER — Ambulatory Visit (INDEPENDENT_AMBULATORY_CARE_PROVIDER_SITE_OTHER): Payer: 59 | Admitting: Internal Medicine

## 2016-05-22 VITALS — BP 124/78 | HR 82 | Temp 98.0°F | Resp 14 | Wt 169.4 lb

## 2016-05-22 DIAGNOSIS — N390 Urinary tract infection, site not specified: Secondary | ICD-10-CM

## 2016-05-22 DIAGNOSIS — R300 Dysuria: Secondary | ICD-10-CM | POA: Diagnosis not present

## 2016-05-22 LAB — POCT URINALYSIS DIPSTICK
BILIRUBIN UA: NEGATIVE
GLUCOSE UA: NEGATIVE
KETONES UA: NEGATIVE
Leukocytes, UA: NEGATIVE
Nitrite, UA: NEGATIVE
PH UA: 6
RBC UA: NEGATIVE
Spec Grav, UA: >=1.03
Urobilinogen, UA: 0.2

## 2016-05-22 NOTE — Progress Notes (Signed)
Subjective:  Patient ID: Dawn [name], female    DOB: 1975/11/24  Age: 40 y.o. MRN: [id number]  CC: The primary encounter diagnosis was Dysuria. A diagnosis of Lower urinary tract infectious disease was also pertinent to this visit.  HPI Dawn [name] presents for follow up on recent empiric treatment for dysuria.  She was treated with 5 days of cipro starting Nov 22 .  All symptoms have resolved.    She was treated on October 8th with Macrobid for dysuria  Which resolved,  Her most recent prior occurrence of dysuria requiring use of antibiotics was in April 2017  She is s/p hysterectomy in 2916, left oophorectomy in 2013 and has regular gyn follow up,   . Still getting night sweats despite using Lopreeza prescribed by her gynecologist.   How many UTis this year?  3 .  How many confirmed with UA and culture?  1.  She recalls that one episode occurred after intercourse,  And one after having loose stools (secondary to IBS)  Lengthy discussion with patient about her expectations of being treated urgently when symptomatic, which resulted in multiple phone calls on  The afternoon Nov 22 (the day before thanksgiving).  (see chart).  She feels that she was treated rudely by stafff and was given "the run around" .  Patient lives in [place] and works in [employer] and states that the office is on her way to work. She   has been given a sterile container to keep for the next occurrence so that she can drop off a specimen prior to starting antibiotics for the next occurrence         Outpatient Medications Prior to Visit  Medication Sig Dispense Refill  . Biotin 5000 MCG CAPS Take 1 capsule by mouth daily.    . Cholecalciferol (VITAMIN D3) 1000 UNITS CAPS Take 1 capsule by mouth daily.    . Estradiol-Norethindrone Acet (LOPREEZA) 0.5-0.1 MG tablet Take 1 tablet by mouth daily.    . Magnesium Oxide -Mg Supplement 250 MG TABS Take 1 tablet by mouth 2 (two) times daily.    . Multiple Vitamin  (MULTIVITAMIN) tablet Take 1 tablet by mouth daily.    [name] pyridOXINE (VITAMIN B-6) 100 MG tablet Take 100 mg by mouth daily.    [name] zinc gluconate 50 MG tablet Take 50 mg by mouth daily.    [name] zolpidem (AMBIEN) 10 MG tablet TAKE 1 TABLET BY MOUTH AT BEDTIME AS NEEDED FOR SLEEP 30 tablet 5  . cephALEXin (KEFLEX) 500 MG capsule Take 500 mg by mouth 2 (two) times daily.  0  . ciprofloxacin (CIPRO) 250 MG tablet Take 1 tablet (250 mg total) by mouth 2 (two) times daily. (Patient not taking: Reported on 05/22/2016) 10 tablet 0  . fexofenadine (ALLEGRA) 180 MG tablet TAKE 1 TABLET BY MOUTH EVERY DAY (Patient not taking: Reported on 05/22/2016) 30 tablet 3  . methylPREDNISolone (MEDROL DOSEPAK) 4 MG TBPK tablet Take as directed    . montelukast (SINGULAIR) 10 MG tablet Take 1 tablet (10 mg total) by mouth at bedtime. (Patient not taking: Reported on 05/22/2016) 30 tablet 3  . nitrofurantoin, macrocrystal-monohydrate, (MACROBID) 100 MG capsule Take 1 capsule (100 mg total) by mouth 2 (two) times daily. (Patient not taking: Reported on 05/22/2016) 10 capsule 0  . phentermine (ADIPEX-P) 37.5 MG tablet 1/2 tablet in the am and early afternoon (Patient not taking: Reported on 05/22/2016) 30 tablet 2  . vitamin A 10000 UNIT capsule Take 10,000  Units by mouth daily.     No facility-administered medications prior to visit.     Review of Systems;  Patient denies headache, fevers, malaise, unintentional weight loss, skin rash, eye pain, sinus congestion and sinus pain, sore throat, dysphagia,  hemoptysis , cough, dyspnea, wheezing, chest pain, palpitations, orthopnea, edema, abdominal pain, nausea, melena, diarrhea, constipation, flank pain, dysuria, hematuria, urinary  Frequency, nocturia, numbness, tingling, seizures,  Focal weakness, Loss of consciousness,  Tremor, insomnia, depression, anxiety, and suicidal ideation.      Objective:  BP 124/78   Pulse 82   Temp 98 F (36.7 C) (Oral)   Resp 14   Wt 169 lb 6.4  oz (76.8 kg)   LMP 11/22/2012   SpO2 98%   BMI 27.34 kg/m   BP Readings from Last 3 Encounters:  05/22/16 124/78  11/05/15 120/84  10/04/15 108/82    Wt Readings from Last 3 Encounters:  05/22/16 169 lb 6.4 oz (76.8 kg)  11/05/15 154 lb 12.8 oz (70.2 kg)  10/04/15 163 lb 8 oz (74.2 kg)    General appearance: alert, cooperative and appears stated age Back: symmetric, no curvature. ROM normal. No CVA tenderness. Lungs: clear to auscultation bilaterally Heart: regular rate and rhythm, S1, S2 normal, no murmur, click, rub or gallop Abdomen: soft, non-tender; bowel sounds normal; no masses,  no organomegaly   No results found.  Assessment & Plan:   Problem List Items Addressed This Visit    Lower urinary tract infectious disease    She has had 3 in one year,  Only one confirmed with culture.  Has some signs and symptoms of early menopause so wonder if symptoms may be due to noninfectious causes of dysuria.  Advised to bring untreated urine specimen prior to treatment and advised to continue daily probiotic.        Other Visit Diagnoses    Dysuria    -  Primary   Relevant Orders   POCT Urinalysis Dipstick (Completed)   Urine Culture   Urine Microscopic Only     A total of 25 minutes of face to face time was spent with patient more than half of which was spent in counselling about the above mentioned conditions  and coordination of care  I am having Ms. Camarena maintain her multivitamin, Biotin, Estradiol-Norethindrone Acet, vitamin A, zinc gluconate, Vitamin D3, pyridOXINE, Magnesium Oxide -Mg Supplement, cephALEXin, methylPREDNISolone, montelukast, phentermine, zolpidem, fexofenadine, nitrofurantoin (macrocrystal-monohydrate), and ciprofloxacin.  No orders of the defined types were placed in this encounter.   There are no discontinued medications.  Follow-up: No Follow-up on file.   Crecencio Mc, MD

## 2016-05-22 NOTE — Progress Notes (Signed)
Pre-visit discussion using our clinic review tool. No additional management support is needed unless otherwise documented below in the visit note.  

## 2016-05-23 LAB — URINALYSIS, MICROSCOPIC ONLY: RBC / HPF: NONE SEEN (ref 0–?)

## 2016-05-23 NOTE — Assessment & Plan Note (Signed)
She has had 3 in one year,  Only one confirmed with culture.  Has some signs and symptoms of early menopause so wonder if symptoms may be due to noninfectious causes of dysuria.  Advised to bring untreated urine specimen prior to treatment and advised to continue daily probiotic.

## 2016-05-24 LAB — URINE CULTURE: ORGANISM ID, BACTERIA: NO GROWTH

## 2016-05-25 ENCOUNTER — Encounter: Payer: Self-pay | Admitting: Internal Medicine

## 2016-07-03 ENCOUNTER — Other Ambulatory Visit: Payer: Self-pay | Admitting: Internal Medicine

## 2016-07-04 ENCOUNTER — Encounter: Payer: Self-pay | Admitting: Internal Medicine

## 2016-07-04 MED ORDER — ZOLPIDEM TARTRATE 10 MG PO TABS: 10.0000 mg | ORAL_TABLET | Freq: Every evening | ORAL | 3 refills | Status: DC | PRN

## 2016-07-04 NOTE — Telephone Encounter (Signed)
Placed script in quick sign

## 2016-07-24 ENCOUNTER — Other Ambulatory Visit (INDEPENDENT_AMBULATORY_CARE_PROVIDER_SITE_OTHER): Payer: 59

## 2016-07-24 ENCOUNTER — Encounter: Payer: Self-pay | Admitting: Internal Medicine

## 2016-07-24 ENCOUNTER — Telehealth: Payer: Self-pay | Admitting: Radiology

## 2016-07-24 DIAGNOSIS — R300 Dysuria: Secondary | ICD-10-CM | POA: Diagnosis not present

## 2016-07-24 LAB — POCT URINALYSIS DIPSTICK
BILIRUBIN UA: NEGATIVE
GLUCOSE UA: NEGATIVE
Leukocytes, UA: NEGATIVE
Nitrite, UA: NEGATIVE
PH UA: 5
Protein, UA: NEGATIVE
RBC UA: NEGATIVE
SPEC GRAV UA: 1.025
Urobilinogen, UA: 0.2

## 2016-07-24 NOTE — Telephone Encounter (Signed)
Please run a POCT, UA with micro and urine culture.  Labs ordered  Thanks

## 2016-07-24 NOTE — Telephone Encounter (Signed)
Pt brought urine sample in today. Pt states that per Dr. Derrel Nip to bring in. Please place orders. Thank you.

## 2016-07-24 NOTE — Addendum Note (Signed)
Addended by: Crecencio Mc on: 07/24/2016 12:45 PM   Modules accepted: Orders

## 2016-07-25 LAB — URINALYSIS, MICROSCOPIC ONLY

## 2016-07-26 ENCOUNTER — Encounter: Payer: Self-pay | Admitting: Internal Medicine

## 2016-07-26 LAB — URINE CULTURE

## 2016-07-27 ENCOUNTER — Encounter: Payer: Self-pay | Admitting: Internal Medicine

## 2016-07-27 ENCOUNTER — Other Ambulatory Visit: Payer: Self-pay | Admitting: Internal Medicine

## 2016-07-27 MED ORDER — CIPROFLOXACIN HCL 250 MG PO TABS: 250.0000 mg | ORAL_TABLET | Freq: Two times a day (BID) | ORAL | 0 refills | Status: DC

## 2016-07-27 NOTE — Addendum Note (Signed)
Addended by: Crecencio Mc on: 07/27/2016 01:14 PM   Modules accepted: Orders

## 2016-08-02 ENCOUNTER — Ambulatory Visit
Admission: RE | Admit: 2016-08-02 | Discharge: 2016-08-02 | Disposition: A | Discharge status: Home / Self Care | Payer: 59 | Source: Ambulatory Visit | Attending: Unknown Physician Specialty | Admitting: Unknown Physician Specialty

## 2016-08-02 ENCOUNTER — Other Ambulatory Visit: Payer: Self-pay | Admitting: Unknown Physician Specialty

## 2016-08-02 DIAGNOSIS — R05 Cough: Secondary | ICD-10-CM | POA: Diagnosis present

## 2016-08-02 DIAGNOSIS — R059 Cough, unspecified: Secondary | ICD-10-CM

## 2016-09-05 ENCOUNTER — Ambulatory Visit (INDEPENDENT_AMBULATORY_CARE_PROVIDER_SITE_OTHER): Payer: 59 | Admitting: Internal Medicine

## 2016-09-05 ENCOUNTER — Encounter: Payer: Self-pay | Admitting: Internal Medicine

## 2016-09-05 DIAGNOSIS — M545 Low back pain, unspecified: Secondary | ICD-10-CM

## 2016-09-05 MED ORDER — METHOCARBAMOL 500 MG PO TABS: 500.0000 mg | ORAL_TABLET | Freq: Four times a day (QID) | ORAL | 3 refills | Status: DC

## 2016-09-05 MED ORDER — PREDNISONE 10 MG PO TABS: ORAL_TABLET | ORAL | 0 refills | Status: DC

## 2016-09-05 NOTE — Patient Instructions (Addendum)
I am prescribing a prednisone taper  and a mild muscle relaxer  to take for the next 6 days instead of ibuprofen and tylenol   Back Pain, Adult Back pain is very common in adults.The cause of back pain is rarely dangerous and the pain often gets better over time.The cause of your back pain may not be known. Some common causes of back pain include:  Strain of the muscles or ligaments supporting the spine.  Wear and tear (degeneration) of the spinal disks.  Arthritis.  Direct injury to the back. For many people, back pain may return. Since back pain is rarely dangerous, most people can learn to manage this condition on their own. Follow these instructions at home: Watch your back pain for any changes. The following actions may help to lessen any discomfort you are feeling:  Remain active. It is stressful on your back to sit or stand in one place for long periods of time. Do not sit, drive, or stand in one place for more than 30 minutes at a time. Take short walks on even surfaces as soon as you are able.Try to increase the length of time you walk each day.  Exercise regularly as directed by your health care provider. Exercise helps your back heal faster. It also helps avoid future injury by keeping your muscles strong and flexible.  Do not stay in bed.Resting more than 1-2 days can delay your recovery.  Pay attention to your body when you bend and lift. The most comfortable positions are those that put less stress on your recovering back. Always use proper lifting techniques, including:  Bending your knees.  Keeping the load close to your body.  Avoiding twisting.  Find a comfortable position to sleep. Use a firm mattress and lie on your side with your knees slightly bent. If you lie on your back, put a pillow under your knees.  Avoid feeling anxious or stressed.Stress increases muscle tension and can worsen back pain.It is important to recognize when you are anxious or stressed  and learn ways to manage it, such as with exercise.  Take medicines only as directed by your health care provider. Over-the-counter medicines to reduce pain and inflammation are often the most helpful.Your health care provider may prescribe muscle relaxant drugs.These medicines help dull your pain so you can more quickly return to your normal activities and healthy exercise.  Apply ice to the injured area:  Put ice in a plastic bag.  Place a towel between your skin and the bag.  Leave the ice on for 20 minutes, 2-3 times a day for the first 2-3 days. After that, ice and heat may be alternated to reduce pain and spasms.  Maintain a healthy weight. Excess weight puts extra stress on your back and makes it difficult to maintain good posture. Contact a health care provider if:  You have pain that is not relieved with rest or medicine.  You have increasing pain going down into the legs or buttocks.  You have pain that does not improve in one week.  You have night pain.  You lose weight.  You have a fever or chills. Get help right away if:  You develop new bowel or bladder control problems.  You have unusual weakness or numbness in your arms or legs.  You develop nausea or vomiting.  You develop abdominal pain.  You feel faint. This information is not intended to replace advice given to you by your health care provider. Make sure  you discuss any questions you have with your health care provider. Document Released: 06/05/2005 Document Revised: 10/14/2015 Document Reviewed: 10/07/2013 Elsevier Interactive Patient Education  2017 [employer].

## 2016-09-05 NOTE — Progress Notes (Signed)
Pre visit review using our clinic review tool, if applicable. No additional management support is needed unless otherwise documented below in the visit note. 

## 2016-09-05 NOTE — Progress Notes (Signed)
Subjective:  Patient ID: Dawn [name], female    DOB: [birth year]  Age: 41 y.o. MRN: [id number]  CC: The encounter diagnosis was Acute left-sided low back pain without sciatica.  HPI Dawn [name] presents for evaluation of low back pain prsent for 3 months,  Now accompanied  by a "knot" on lower left side .[name] Has a history of lipoma on the right side diagnosed several years ago .   History of DDD by prior MRI  Years ago, which was ordered after several episodes of severe pain.  Currently her pain has been constant and low grade.    The pain is brought on by sleeping too long,  And resolves after  20 minutes of upright activity, but aggravated by  prolonged standing ,  prolonged sitting,.  The pain does not radiate down either leg   Outpatient Medications Prior to Visit  Medication Sig Dispense Refill  . Biotin 5000 MCG CAPS Take 1 capsule by mouth daily.    . Cholecalciferol (VITAMIN D3) 1000 UNITS CAPS Take 1 capsule by mouth daily.    . Estradiol-Norethindrone Acet (LOPREEZA) 0.5-0.1 MG tablet Take 1 tablet by mouth daily.    . Magnesium Oxide -Mg Supplement 250 MG TABS Take 1 tablet by mouth 2 (two) times daily.    . montelukast (SINGULAIR) 10 MG tablet Take 1 tablet (10 mg total) by mouth at bedtime. 30 tablet 3  . Multiple Vitamin (MULTIVITAMIN) tablet Take 1 tablet by mouth daily.    [name] zolpidem (AMBIEN) 10 MG tablet Take 1 tablet (10 mg total) by mouth at bedtime as needed. for sleep 30 tablet 3  . cephALEXin (KEFLEX) 500 MG capsule Take 500 mg by mouth 2 (two) times daily.  0  . ciprofloxacin (CIPRO) 250 MG tablet Take 1 tablet (250 mg total) by mouth 2 (two) times daily. (Patient not taking: Reported on 09/05/2016) 10 tablet 0  . fexofenadine (ALLEGRA) 180 MG tablet TAKE 1 TABLET BY MOUTH EVERY DAY (Patient not taking: Reported on 05/22/2016) 30 tablet 3  . methylPREDNISolone (MEDROL DOSEPAK) 4 MG TBPK tablet Take as directed    . nitrofurantoin, macrocrystal-monohydrate, (MACROBID)  100 MG capsule Take 1 capsule (100 mg total) by mouth 2 (two) times daily. (Patient not taking: Reported on 05/22/2016) 10 capsule 0  . phentermine (ADIPEX-P) 37.5 MG tablet 1/2 tablet in the am and early afternoon (Patient not taking: Reported on 05/22/2016) 30 tablet 2  . pyridOXINE (VITAMIN B-6) 100 MG tablet Take 100 mg by mouth daily.    . vitamin A 10000 UNIT capsule Take 10,000 Units by mouth daily.    [name] zinc gluconate 50 MG tablet Take 50 mg by mouth daily.     No facility-administered medications prior to visit.     Review of Systems;  Patient denies headache, fevers, malaise, unintentional weight loss, skin rash, eye pain, sinus congestion and sinus pain, sore throat, dysphagia,  hemoptysis , cough, dyspnea, wheezing, chest pain, palpitations, orthopnea, edema, abdominal pain, nausea, melena, diarrhea, constipation, flank pain, dysuria, hematuria, urinary  Frequency, nocturia, numbness, tingling, seizures,  Focal weakness, Loss of consciousness,  Tremor, insomnia, depression, anxiety, and suicidal ideation.      Objective:  BP 118/88 (BP Location: Left Arm, Patient Position: Sitting, Cuff Size: Normal)   Pulse 83   Resp 16   Ht 5\' 6"  (1.676 m)   Wt 168 lb (76.2 kg)   LMP 11/22/2012   SpO2 98%   BMI 27.12 kg/m   BP  Readings from Last 3 Encounters:  09/05/16 118/88  05/22/16 124/78  11/05/15 120/84    Wt Readings from Last 3 Encounters:  09/05/16 168 lb (76.2 kg)  05/22/16 169 lb 6.4 oz (76.8 kg)  11/05/15 154 lb 12.8 oz (70.2 kg)    General appearance: alert, cooperative and appears stated age Neck: no adenopathy, no carotid bruit, supple, symmetrical, trachea midline and thyroid not enlarged, symmetric, no tenderness/mass/nodules Back: symmetric, no curvature. ROM normal. No CVA or spinal tenderness.  Lipoma present in right paraspinous muscles. Left paraspinous muscle tenderness without ass. Lungs: clear to auscultation bilaterally Heart: regular rate and rhythm,  S1, S2 normal, no murmur, click, rub or gallop Abdomen: soft, non-tender; bowel sounds normal; no masses,  no organomegaly Pulses: 2+ and symmetric Neuro:  awake and interactive with normal mood and affect. Higher cortical functions are normal. Speech is clear without word-finding difficulty or dysarthria. Extraocular movements are intact. Visual fields of both eyes are grossly intact. Sensation to light touch is grossly intact bilaterally of upper and lower extremities. Motor examination shows 4+/5 symmetric hand grip and upper extremity and 5/5 lower extremity strength. There is no pronation or drift. Gait is non-ataxic Skin: Skin color, texture, turgor normal. No rashes or lesions Lymph nodes: Cervical, supraclavicular, and axillary nodes normal.  No results found for: HGBA1C  Lab Results  Component Value Date   CREATININE 0.62 05/24/2015   CREATININE 0.65 07/15/2014   CREATININE 0.64 11/21/2011    Lab Results  Component Value Date   WBC 6.5 07/15/2014   HGB 13.6 07/15/2014   HCT 41.2 07/15/2014   PLT 338 07/15/2014   GLUCOSE 81 05/24/2015   CHOL 215 (H) 05/24/2015   TRIG 160.0 (H) 05/24/2015   HDL 73.50 05/24/2015   LDLDIRECT 130.4 11/21/2011   LDLCALC 109 (H) 05/24/2015   ALT 27 05/24/2015   AST 22 05/24/2015   NA 138 05/24/2015   K 4.3 05/24/2015   CL 100 05/24/2015   CREATININE 0.62 05/24/2015   BUN 11 05/24/2015   CO2 27 05/24/2015   TSH 1.72 05/24/2015     Assessment & Plan:   Problem List Items Addressed This Visit    Low back pain without sciatica    Secondary to mild ddd and muscle strain .  Prednisone taper, muscle relaxer, activity modification      Relevant Medications   predniSONE (DELTASONE) 10 MG tablet   methocarbamol (ROBAXIN) 500 MG tablet    A total of 25 minutes of face to face time was spent with patient more than half of which was spent in counselling about the above mentioned conditions  and coordination of care   I have discontinued Ms.  Dawn [name]'s vitamin A, zinc gluconate, pyridOXINE, cephALEXin, methylPREDNISolone, phentermine, fexofenadine, nitrofurantoin (macrocrystal-monohydrate), and ciprofloxacin. I am also having her start on predniSONE and methocarbamol. Additionally, I am having her maintain her multivitamin, Biotin, Estradiol-Norethindrone Acet, Vitamin D3, Magnesium Oxide -Mg Supplement, montelukast, zolpidem, and vitamin B-12.  Meds ordered this encounter  Medications  . vitamin B-12 (CYANOCOBALAMIN) 1000 MCG tablet    Sig: Take by mouth.  . predniSONE (DELTASONE) 10 MG tablet    Sig: 6 tablets on Day 1 , then reduce by 1 tablet daily until gone    Dispense:  21 tablet    Refill:  0  . methocarbamol (ROBAXIN) 500 MG tablet    Sig: Take 1 tablet (500 mg total) by mouth 4 (four) times daily. As needed for muscle spasm    Dispense:  30 tablet    Refill:  3    Medications Discontinued During This Encounter  Medication Reason  . cephALEXin (KEFLEX) 500 MG capsule Therapy completed  . ciprofloxacin (CIPRO) 250 MG tablet Therapy completed  . fexofenadine (ALLEGRA) 180 MG tablet Patient has not taken in last 30 days  . methylPREDNISolone (MEDROL DOSEPAK) 4 MG TBPK tablet Therapy completed  . nitrofurantoin, macrocrystal-monohydrate, (MACROBID) 100 MG capsule Patient has not taken in last 30 days  . phentermine (ADIPEX-P) 37.5 MG tablet Patient has not taken in last 30 days  . pyridOXINE (VITAMIN B-6) 100 MG tablet Patient has not taken in last 30 days  . vitamin A 10000 UNIT capsule Patient has not taken in last 30 days  . zinc gluconate 50 MG tablet Patient has not taken in last 30 days    Follow-up: No Follow-up on file.   Crecencio Mc, MD

## 2016-09-06 DIAGNOSIS — M545 Low back pain, unspecified: Secondary | ICD-10-CM | POA: Insufficient documentation

## 2016-09-06 NOTE — Assessment & Plan Note (Signed)
Secondary to mild ddd and muscle strain .  Prednisone taper, muscle relaxer, activity modification

## 2016-10-02 ENCOUNTER — Encounter: Payer: Self-pay | Admitting: Internal Medicine

## 2016-10-02 DIAGNOSIS — M545 Low back pain, unspecified: Secondary | ICD-10-CM

## 2016-10-02 MED ORDER — CELECOXIB 200 MG PO CAPS: 200.0000 mg | ORAL_CAPSULE | Freq: Two times a day (BID) | ORAL | 0 refills | Status: DC

## 2016-10-02 NOTE — Assessment & Plan Note (Signed)
No relief with prednisone and MR per [employer].  Additional meds requested,  celebrex sent.

## 2016-10-04 ENCOUNTER — Telehealth: Payer: Self-pay | Admitting: Internal Medicine

## 2016-10-04 MED ORDER — TRAMADOL HCL 50 MG PO TABS: 50.0000 mg | ORAL_TABLET | Freq: Three times a day (TID) | ORAL | 0 refills | Status: DC | PRN

## 2016-10-04 MED ORDER — PREDNISONE 10 MG PO TABS: ORAL_TABLET | ORAL | 0 refills | Status: DC

## 2016-10-04 NOTE — Telephone Encounter (Addendum)
Patient advised of below and verbalized an understanding.   Wants to hold off on referral process for now until get allergic reaction under control.  States back felt much better while she was on celebrex even though she only took 2 doses.  Script faxed to [facility] for Ultram.

## 2016-10-04 NOTE — Telephone Encounter (Signed)
Pt called, she thinks she is allergic to celecoxib (CELEBREX) 200 MG capsule, that Dr. Derrel Nip Prescribed. Face is swollen. She is requesting another medication. Please call her at [phone].

## 2016-10-04 NOTE — Telephone Encounter (Signed)
Reason for call: face swollen looks like sunburn  Symptoms: face swollen  , looks like sunburn spreading down to neck  , hot to touch,  rash abdomen, no shortness of breath,  Duration yesterday Medications: 2 doses of Celebrex, Benadryl this am  Last seen for this problem: prescribed 10/02/16 Patient states she has taken Celebrex or similar medication before and had similar allergic reaction.  Advised if developed shortness of breath to call 911 and seek immediate medical attention.   Please advise

## 2016-10-04 NOTE — Telephone Encounter (Deleted)
Left voice mail to call back  Reason for call:allergic to Celebrex Symptoms: face swollen Duration  Medications: Last seen for this problem: Seen by:

## 2016-10-04 NOTE — Telephone Encounter (Signed)
She will need to take a prednisone taper for the reaction , along with benadryl and pepcid or zantac twice daily for the next 48 hours  .  I cannot prescribe anything else for her back other than tramadol, without seeing her,  So I will send an rx to her pharmacy  For that.  If her pain persists,  She will need to see an orthopedist or sports medicine doctor . Does she have a preference? I will start the referral process

## 2016-10-04 NOTE — Telephone Encounter (Signed)
Pt called back returning your call. Thank you! °

## 2016-10-19 ENCOUNTER — Ambulatory Visit (INDEPENDENT_AMBULATORY_CARE_PROVIDER_SITE_OTHER): Payer: 59 | Admitting: Family

## 2016-10-19 ENCOUNTER — Encounter: Payer: Self-pay | Admitting: Family

## 2016-10-19 VITALS — BP 128/86 | HR 90 | Temp 98.7°F | Resp 12 | Wt 170.4 lb

## 2016-10-19 DIAGNOSIS — M62838 Other muscle spasm: Secondary | ICD-10-CM

## 2016-10-19 MED ORDER — CYCLOBENZAPRINE HCL 5 MG PO TABS: 5.0000 mg | ORAL_TABLET | Freq: Every evening | ORAL | 0 refills | Status: DC | PRN
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
## 2016-10-19 NOTE — Progress Notes (Signed)
Pre visit review using our clinic review tool, if applicable. No additional management support is needed unless otherwise documented below in the visit note. 
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
## 2016-10-20 ENCOUNTER — Encounter: Payer: Self-pay | Admitting: Family

## 2016-10-22 ENCOUNTER — Encounter: Payer: Self-pay | Admitting: Family

## 2016-10-24 ENCOUNTER — Encounter: Payer: Self-pay | Admitting: Family

## 2016-10-30 ENCOUNTER — Telehealth: Payer: Self-pay | Admitting: Internal Medicine

## 2016-10-30 NOTE — Telephone Encounter (Signed)
error 

## 2016-10-30 NOTE — Telephone Encounter (Signed)
Pt called and stated that she is still getting these headaches along with continuing neck and shoulder problems. Please advise, thank you!  Call pt @ [phone]

## 2016-11-01 ENCOUNTER — Telehealth: Payer: Self-pay | Admitting: Family

## 2016-11-01 ENCOUNTER — Encounter: Payer: Self-pay | Admitting: Family

## 2016-11-01 ENCOUNTER — Other Ambulatory Visit: Payer: Self-pay | Admitting: Family

## 2016-11-01 DIAGNOSIS — M542 Cervicalgia: Secondary | ICD-10-CM

## 2016-11-01 MED ORDER — IBUPROFEN 600 MG PO TABS: 600.0000 mg | ORAL_TABLET | Freq: Three times a day (TID) | ORAL | 0 refills | Status: DC | PRN
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
## 2016-11-01 NOTE — Telephone Encounter (Signed)
Noted! Thank you

## 2016-11-01 NOTE — Telephone Encounter (Signed)
Please call pt=  I want to ensure she got my below message.  Please also get more info on headaches- she may need appt.    -------  I am sorry. I reviewed your chart and for reason your message regarding starting ibuprofen didn't come to me. I am so sorry.  The medication I would try first is prescription strength ibuprofen. Do you have an allergy to this?  With the headaches, I would assume may be coming from your neck however if they have changed in intensity or severity, I would like to see you and decide if we need to pursue imaging.  I have ordered the cervical spine xrays and you may call and make an appointment to have them done.   Joycelyn Schmid

## 2016-11-02 ENCOUNTER — Other Ambulatory Visit: Payer: Self-pay | Admitting: Family

## 2016-11-02 ENCOUNTER — Ambulatory Visit (INDEPENDENT_AMBULATORY_CARE_PROVIDER_SITE_OTHER): Payer: 59

## 2016-11-02 ENCOUNTER — Encounter: Payer: Self-pay | Admitting: Family

## 2016-11-02 DIAGNOSIS — M542 Cervicalgia: Secondary | ICD-10-CM

## 2016-11-14 ENCOUNTER — Ambulatory Visit (INDEPENDENT_AMBULATORY_CARE_PROVIDER_SITE_OTHER): Payer: 59 | Admitting: Family

## 2016-11-14 ENCOUNTER — Ambulatory Visit (INDEPENDENT_AMBULATORY_CARE_PROVIDER_SITE_OTHER): Payer: 59

## 2016-11-14 ENCOUNTER — Encounter: Payer: Self-pay | Admitting: Family

## 2016-11-14 VITALS — BP 112/68 | HR 68 | Temp 98.3°F | Ht 66.0 in | Wt 170.0 lb

## 2016-11-14 DIAGNOSIS — M25512 Pain in left shoulder: Secondary | ICD-10-CM | POA: Insufficient documentation

## 2016-11-14 DIAGNOSIS — R519 Headache, unspecified: Secondary | ICD-10-CM

## 2016-11-14 DIAGNOSIS — R51 Headache: Secondary | ICD-10-CM

## 2016-11-14 DIAGNOSIS — F411 Generalized anxiety disorder: Secondary | ICD-10-CM

## 2016-11-14 MED ORDER — SERTRALINE HCL 50 MG PO TABS: 50.0000 mg | ORAL_TABLET | Freq: Every day | ORAL | 3 refills | Status: DC
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
## 2016-11-14 NOTE — Assessment & Plan Note (Signed)
Particularly situation- with driving. Trial Zoloft. Follow-up 8 weeks

## 2016-11-14 NOTE — Assessment & Plan Note (Signed)
Symptoms consistent with localized tenderness over AC joint. Exam does not support rotator cuff pathology. No reduction in ROM. Pending x-ray. Discussed with patient conservative treatment including NSAIDs, heat.

## 2016-11-14 NOTE — Progress Notes (Signed)
Pre visit review using our clinic review tool, if applicable. No additional management support is needed unless otherwise documented below in the visit note. 

## 2016-11-14 NOTE — Assessment & Plan Note (Signed)
Frequency of 3 headaches per week. Reassured by normal neurologic exam .Patient and I jointly decided to pursue MRI due to early morning headaches which awaken patient. IF normal MRI, discussed with patient starting daily preventative medication such as propanolol. Also suspect anxiety is playing a role as headache. We'll try Zoloft to see if this relieves headaches.

## 2016-11-14 NOTE — Patient Instructions (Signed)
Remove belly button ring for MRI  Trial zoloft at BEDTIME  F/u 8 weeks, sooner if symptoms worsen

## 2016-11-16 ENCOUNTER — Other Ambulatory Visit: Payer: Self-pay | Admitting: Family

## 2016-11-16 DIAGNOSIS — R519 Headache, unspecified: Secondary | ICD-10-CM

## 2016-11-16 DIAGNOSIS — R51 Headache: Principal | ICD-10-CM

## 2016-11-22 ENCOUNTER — Encounter: Payer: Self-pay | Admitting: Family

## 2016-11-23 ENCOUNTER — Encounter: Payer: Self-pay | Admitting: Family

## 2016-11-23 ENCOUNTER — Ambulatory Visit
Admission: RE | Admit: 2016-11-23 | Discharge: 2016-11-23 | Disposition: A | Discharge status: Home / Self Care | Payer: 59 | Source: Ambulatory Visit | Attending: Family | Admitting: Family

## 2016-11-23 DIAGNOSIS — R51 Headache: Secondary | ICD-10-CM | POA: Insufficient documentation

## 2016-11-23 DIAGNOSIS — R519 Headache, unspecified: Secondary | ICD-10-CM

## 2016-11-23 MED ORDER — GADOBENATE DIMEGLUMINE 529 MG/ML IV SOLN
20.0000 mL | Freq: Once | INTRAVENOUS | Status: AC | PRN
  Administered 2016-11-23: 16 mL via INTRAVENOUS

## 2016-11-24 ENCOUNTER — Other Ambulatory Visit: Payer: Self-pay | Admitting: Family

## 2016-11-24 DIAGNOSIS — R519 Headache, unspecified: Secondary | ICD-10-CM

## 2016-11-24 DIAGNOSIS — R51 Headache: Principal | ICD-10-CM

## 2016-11-24 MED ORDER — PROPRANOLOL HCL 20 MG PO TABS: 20.0000 mg | ORAL_TABLET | Freq: Every day | ORAL | 0 refills | Status: DC

## 2016-11-27 ENCOUNTER — Telehealth: Payer: Self-pay

## 2016-11-27 ENCOUNTER — Other Ambulatory Visit: Payer: Self-pay | Admitting: Family

## 2016-11-27 DIAGNOSIS — R519 Headache, unspecified: Secondary | ICD-10-CM

## 2016-11-27 DIAGNOSIS — R51 Headache: Principal | ICD-10-CM

## 2016-11-27 MED ORDER — TOPIRAMATE 25 MG PO TABS: 25.0000 mg | ORAL_TABLET | Freq: Every day | ORAL | 0 refills | Status: DC

## 2016-11-27 NOTE — Telephone Encounter (Signed)
Patient has been notified and is grateful for medication change.  Patient stated she will see you at Follow Up Appointment.

## 2016-11-27 NOTE — Telephone Encounter (Signed)
I spoke with patient  Discussed that I have never seen weight gain with propranolol however If she is more comfortable , we use topamax for headache prevention. It is historically a seizure medication.   Topamax is taken at BEDTIME.   It is also associated with weight  Loss which I am sure she will be pleased with  Takes half tablet of ambien.   Due to drug interaction, advised to NOT take ambien for one week while on topamax to ensure not to drowsey. She may add in [place] however stay very vigilant for sedative effects. Patient verbalized understanding and preferred this  Medication over propranolol

## 2016-11-27 NOTE — Telephone Encounter (Signed)
Patient does not wish to try Inderal for headaches due to the excessive weight gain.  Patient stated she would like something that doesn't cause weight gain for headaches. Patient is staying on the zoloft. Please advise.

## 2016-11-28 ENCOUNTER — Encounter: Payer: Self-pay | Admitting: Family

## 2016-12-13 ENCOUNTER — Encounter: Payer: Self-pay | Admitting: Internal Medicine

## 2016-12-26 ENCOUNTER — Encounter: Payer: Self-pay | Admitting: Family

## 2016-12-26 ENCOUNTER — Ambulatory Visit (INDEPENDENT_AMBULATORY_CARE_PROVIDER_SITE_OTHER): Payer: BLUE CROSS/BLUE SHIELD | Admitting: Family

## 2016-12-26 VITALS — BP 110/74 | HR 80 | Temp 97.7°F | Ht 66.0 in | Wt 170.6 lb

## 2016-12-26 DIAGNOSIS — R519 Headache, unspecified: Secondary | ICD-10-CM

## 2016-12-26 DIAGNOSIS — R51 Headache: Secondary | ICD-10-CM | POA: Diagnosis not present

## 2016-12-26 DIAGNOSIS — F411 Generalized anxiety disorder: Secondary | ICD-10-CM | POA: Diagnosis not present

## 2016-12-26 NOTE — Assessment & Plan Note (Signed)
Waxing and waning. Initial improvement after we increased Topamax 50 mg however headaches have recurred. We jointly agreed to go ahead and increase Topamax with goal to get 100 mg per day in divided doses. I went ahead and placed referral to neurology to ensure sure that patient had other options if HA does not respond to increased Topamax. Follow up 2-3 months.

## 2016-12-26 NOTE — Patient Instructions (Signed)
As discussed, less try increasing the Topamax For this next week , take 25 mg in the morning and 50 mg at bedtime as you been doing. Please monitor for any signs or symptoms of being too drowsy from taking Topamax along with Ambien Ultimately, we may reach a total of 100 mg per day over divided doses. This would mean taking 50 mg in the morning and 50 mg in the evening.  Referral to neurology  Pleasure seeing you

## 2016-12-26 NOTE — Progress Notes (Signed)
Pre visit review using our clinic review tool, if applicable. No additional management support is needed unless otherwise documented below in the visit note. 
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
## 2016-12-26 NOTE — Assessment & Plan Note (Signed)
Modest improvement on Zoloft. We'll continue at this time.

## 2016-12-29 ENCOUNTER — Encounter: Payer: Self-pay | Admitting: Family

## 2017-01-04 ENCOUNTER — Other Ambulatory Visit: Payer: Self-pay | Admitting: Internal Medicine

## 2017-01-12 DIAGNOSIS — G44309 Post-traumatic headache, unspecified, not intractable: Secondary | ICD-10-CM | POA: Diagnosis not present

## 2017-01-12 DIAGNOSIS — G4752 REM sleep behavior disorder: Secondary | ICD-10-CM | POA: Diagnosis not present

## 2017-01-12 DIAGNOSIS — G44221 Chronic tension-type headache, intractable: Secondary | ICD-10-CM | POA: Diagnosis not present

## 2017-03-07 DIAGNOSIS — G4752 REM sleep behavior disorder: Secondary | ICD-10-CM | POA: Diagnosis not present

## 2017-03-07 DIAGNOSIS — G44309 Post-traumatic headache, unspecified, not intractable: Secondary | ICD-10-CM | POA: Diagnosis not present

## 2017-03-07 DIAGNOSIS — G44221 Chronic tension-type headache, intractable: Secondary | ICD-10-CM | POA: Diagnosis not present

## 2017-03-15 DIAGNOSIS — J0140 Acute pansinusitis, unspecified: Secondary | ICD-10-CM | POA: Diagnosis not present

## 2017-03-25 ENCOUNTER — Encounter: Payer: Self-pay | Admitting: Internal Medicine

## 2017-04-20 DIAGNOSIS — G44221 Chronic tension-type headache, intractable: Secondary | ICD-10-CM | POA: Diagnosis not present

## 2017-04-27 DIAGNOSIS — G44221 Chronic tension-type headache, intractable: Secondary | ICD-10-CM | POA: Diagnosis not present

## 2017-05-04 DIAGNOSIS — G44221 Chronic tension-type headache, intractable: Secondary | ICD-10-CM | POA: Diagnosis not present

## 2017-06-04 ENCOUNTER — Ambulatory Visit: Payer: BLUE CROSS/BLUE SHIELD | Admitting: Family Medicine

## 2017-06-04 ENCOUNTER — Encounter: Payer: Self-pay | Admitting: Family Medicine

## 2017-06-04 VITALS — BP 110/70 | HR 69 | Temp 98.0°F | Resp 17 | Ht 66.0 in | Wt 168.0 lb

## 2017-06-04 DIAGNOSIS — R6889 Other general symptoms and signs: Secondary | ICD-10-CM

## 2017-06-04 DIAGNOSIS — R221 Localized swelling, mass and lump, neck: Secondary | ICD-10-CM

## 2017-06-04 NOTE — Patient Instructions (Signed)
We have ordered labs or studies at this visit. It can take up to 1-2 weeks for results and processing. IF results require follow up or explanation, we will call you with instructions. Clinically stable results will be released to your [facility]. If you have not heard from [place] or cannot find your results in [facility] in 2 weeks please contact our office at [phone].  If you are not yet signed up for [facility], please consider signing up   Please follow up with your provider for your physical as scheduled.  Thyroid-Stimulating Hormone Test Why am I having this test? A thyroid-stimulating hormone (TSH) test is a blood test that is done to measure the level of TSH, also known as thyrotropin, in your blood. TSH is produced by the pituitary gland. The pituitary gland is a small organ located just below the brain, behind your eyes and nasal passages. It is part of a system that monitors and maintains thyroid hormone levels and thyroid gland function. Thyroid hormones affect many body parts and systems, including the system that affects how quickly your body burns fuel for energy. Your health care provider may recommend testing your TSH level if you have signs and symptoms of abnormal thyroid hormone levels. Knowing the level of TSH in your blood can help your health care provider:  Diagnose a thyroid gland or pituitary gland disorder.  Manage your condition and treatment if you have hypothyroidism or hyperthyroidism.  What kind of sample is taken? A blood sample is required for this test. It is usually collected by inserting a needle into a vein. How do I prepare for this test? There is no preparation required for this test. What are the reference ranges? Reference rangesare considered healthy rangesestablished after testing a large group of healthy people. Reference rangesmay vary among different people, labs, and hospitals. It is your responsibility to obtain your test results. Ask the lab or  department performing the test when and how you will get your results. Range of Normal Values:  Adult: 0.3-5 microunits/mL or 0.3-5 milliunits/L (SI units).  Newborn: 53-18 microunits/mL or 3-18 milliunits/L.  Cord: 3-12 microunits/mL or 3-12 milliunits/L.  What do the results mean? A high level of TSH may mean:  Your thyroid gland is not making enough thyroid hormones. When the thyroid gland does not make enough thyroid hormones, the pituitary gland releases TSH into the bloodstream. The higher-than-normal levels of TSH prompt the thyroid gland to release more thyroid hormones.  You are getting an insufficient level of thyroid hormone medicine, if you are receiving this type of treatment.  There is a problem with the pituitary gland (rare).  A low level of TSH can indicate a problem with the pituitary gland. Talk with your health care provider to discuss your results, treatment options, and if necessary, the need for more tests. Talk with your health care provider if you have any questions about your results. Talk with your health care provider to discuss your results, treatment options, and if necessary, the need for more tests. Talk with your health care provider if you have any questions about your results. This information is not intended to replace advice given to you by your health care provider. Make sure you discuss any questions you have with your health care provider. Document Released: 06/30/2004 Document Revised: 02/06/2016 Document Reviewed: 10/29/2013 Elsevier Interactive Patient Education  [employer].

## 2017-06-04 NOTE — Progress Notes (Signed)
Subjective:    Patient ID: Dawn [name], female    DOB: May 27, 1976, 41 y.o.   MRN: [id number]  HPI  Dawn [name] is a 41 year old female who presents today stating that she believes that her thyroid has been swelling. She reports that this was noticed over the weekend (one to two days ago) as she has noted her neck appears to be getting larger. She is not currently taking medication for her thyroid and does not have a history of hypo/hyperthyroidism.  She reports that this concerned her as she states that she feels cold often and reports reading this as a symptom of thyroid disease.  Pertinent negative or absent signs and symptoms are as follows: Constitutional: No significant change in weight; significant fatigue; sleep disorder; change in appetite. Eye: no blurred, double ,loss of vision Cardiovascular: no palpitations; racing; irregularity ENT/GI: no constipation; diarrhea;hoarseness;dysphagia;  Derm: no change in nails,hair,skin.  Neuro: no numbness or tingling; tremor Psych: no depression; panic attacks. History of GAD present however she denies anxious mood today. Endo: no temperature intolerance to heat;  She reports feeling cold and this is new to her.   Wt Readings from Last 3 Encounters:  06/04/17 168 lb (76.2 kg)  12/26/16 170 lb 9.6 oz (77.4 kg)  11/14/16 170 lb (77.1 kg)   She has a routine physical with her GYN in January 2019 and states that her routine lab work will be obtained at that time and is interested in thyroid only today.   Review of Systems  Constitutional: Negative for chills, fatigue and fever.  HENT: Negative for sore throat and trouble swallowing.   Respiratory: Negative for cough, shortness of breath and wheezing.   Cardiovascular: Negative for chest pain and palpitations.  Gastrointestinal: Negative for abdominal pain, constipation, diarrhea, nausea and vomiting.  Endocrine: Positive for cold intolerance.  Genitourinary: Negative for dysuria and  frequency.  Musculoskeletal: Negative for myalgias.  Neurological: Negative for dizziness, weakness and headaches.  Psychiatric/Behavioral:       Denies depressed or anxious mood today   Past Medical History:  Diagnosis Date  . 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 Overweight (BMI 25.0-29.9)   . Plantar fasciitis      Social History   Socioeconomic History  . Marital status: Married    Spouse name: Not on file  . Number of children: Not on file  . Years of education: Not on file  . Highest education level: Not on file  Social Needs  . Financial resource strain: Not on file  . Food insecurity - worry: Not on file  . Food insecurity - inability: Not on file  . Transportation needs - medical: Not on file  . Transportation needs - non-medical: Not on file  Occupational History  . Not on file  Tobacco Use  . Smoking status: Never Smoker  . Smokeless tobacco: Never Used  Substance and Sexual Activity  . Alcohol use: Yes  . Drug use: No  . Sexual activity: Not on file  Other Topics Concern  . Not on file  Social History Narrative  . Not on file    Past Surgical History:  Procedure Laterality Date  . ABDOMINAL HYSTERECTOMY  2016  . CESAREAN SECTION  2004   breech birth  . CHOLESTEATOMA EXCISION     [facility], with some resiudal loss of hearing  . LEFT OOPHORECTOMY  2013   secondary to granulosa cell tumor    Family History  Problem Relation Age of Onset  . Cancer Mother        Lung Cancer, tobacco related  . Cancer Father        melanoma  . Diabetes Maternal Grandmother   . Diabetes Paternal Grandfather     Allergies  Allergen Reactions  . Sulfa Drugs Cross Reactors   . Celebrex [Celecoxib] Swelling and Rash  . Pumpkin Seed Oil [Germanium] Rash    Current Outpatient Medications on File Prior to Visit  Medication Sig Dispense Refill  . Biotin 5000 MCG CAPS Take 1 capsule by mouth daily.    . Estradiol-Norethindrone Acet (LOPREEZA) 0.5-0.1 MG tablet  Take 1 tablet by mouth daily.    . Magnesium Oxide -Mg Supplement 250 MG TABS Take 1 tablet by mouth 2 (two) times daily.    [name] MELATONIN PO Take 8 mg by mouth at bedtime.    . Multiple Vitamin (MULTIVITAMIN) tablet Take 1 tablet by mouth daily.    . sertraline (ZOLOFT) 50 MG tablet   3  . SUMAtriptan (IMITREX) 100 MG tablet Take 100 mg by mouth every 2 (two) hours as needed for migraine. May repeat in 2 hours if headache persists or recurs.    . topiramate (TOPAMAX) 25 MG tablet Take 1 tablet (25 mg total) by mouth at bedtime. (Patient taking differently: Take 150 mg by mouth at bedtime. ) 90 tablet 0  . Cholecalciferol (VITAMIN D3) 1000 UNITS CAPS Take 1 capsule by mouth daily.    . vitamin B-12 (CYANOCOBALAMIN) 1000 MCG tablet Take by mouth.     No current facility-administered medications on file prior to visit.     BP 110/70 (BP Location: Left Arm, Patient Position: Sitting, Cuff Size: Normal)   Pulse 69   Temp 98 F (36.7 C) (Oral)   Resp 17   Ht 5\' 6"  (1.676 m)   Wt 168 lb (76.2 kg)   LMP 11/22/2012   SpO2 98%   BMI 27.12 kg/m       Objective:   Physical Exam  Constitutional: She is oriented to person, place, and time. She appears well-developed and well-nourished.  Eyes: Pupils are equal, round, and reactive to light. No scleral icterus.  Neck: Trachea normal. Neck supple. No muscular tenderness present. No tracheal deviation, no edema and no erythema present. No thyromegaly present.  No edema appreciated on neck today. No palpable nodules or enlarged thyroid gland appreciated. No pain with palpation; no cervical lymphadenopathy noted  Cardiovascular: Normal rate, regular rhythm and intact distal pulses.  Pulmonary/Chest: Effort normal and breath sounds normal. She has no wheezes. She has no rales.  Abdominal: Soft. Bowel sounds are normal. There is no tenderness. There is no rebound.  Musculoskeletal: She exhibits no edema.  Lymphadenopathy:    She has no cervical  adenopathy.  Neurological: She is alert and oriented to person, place, and time. Coordination normal.  Skin: Skin is warm and dry. No rash noted.  Psychiatric: She has a normal mood and affect. Her behavior is normal. Judgment and thought content normal.       Assessment & Plan:  1. Intolerance to cold Exam is reassuring today; no evidence of edema in neck noted; no difficulty swallowing or pain noted; no palpable thyroid nodules or enlargement appreciated today. Will check TSH for patient. Other lab work such as CBC will not be obtained as patient would like to have further lab work completed with her provider. She is only interested in thyroid evaluation at this time. Results  of TSH will determine next steps of additional lab work or imaging if needed.  - TSH  Delano Metz, FNP-C

## 2017-06-05 ENCOUNTER — Telehealth: Payer: Self-pay | Admitting: Internal Medicine

## 2017-06-05 LAB — TSH: TSH: 1.36 u[IU]/mL (ref 0.35–4.50)

## 2017-06-05 NOTE — Telephone Encounter (Signed)
Please advise 

## 2017-06-05 NOTE — Telephone Encounter (Signed)
Copied from [place]. Topic: Quick Communication - See Telephone Encounter >> Jun 05, 2017  1:58 PM Bea Graff, NT wrote: CRM for notification. See Telephone encounter for: Pt requesting lab results.  06/05/17.

## 2017-06-07 DIAGNOSIS — R221 Localized swelling, mass and lump, neck: Secondary | ICD-10-CM | POA: Diagnosis not present

## 2017-06-07 NOTE — Telephone Encounter (Signed)
Spoke with pt and informed her of her lab results. Pt gave a verbal understanding.  

## 2017-06-08 ENCOUNTER — Encounter: Payer: Self-pay | Admitting: *Deleted

## 2017-06-14 DIAGNOSIS — R5383 Other fatigue: Secondary | ICD-10-CM | POA: Diagnosis not present

## 2017-06-14 DIAGNOSIS — R221 Localized swelling, mass and lump, neck: Secondary | ICD-10-CM | POA: Diagnosis not present

## 2017-06-14 DIAGNOSIS — Z0000 Encounter for general adult medical examination without abnormal findings: Secondary | ICD-10-CM | POA: Diagnosis not present

## 2017-06-25 DIAGNOSIS — G44221 Chronic tension-type headache, intractable: Secondary | ICD-10-CM | POA: Diagnosis not present

## 2017-06-25 DIAGNOSIS — Z01419 Encounter for gynecological examination (general) (routine) without abnormal findings: Secondary | ICD-10-CM | POA: Diagnosis not present

## 2017-06-25 DIAGNOSIS — G44309 Post-traumatic headache, unspecified, not intractable: Secondary | ICD-10-CM | POA: Diagnosis not present

## 2017-06-25 DIAGNOSIS — G4752 REM sleep behavior disorder: Secondary | ICD-10-CM | POA: Diagnosis not present

## 2017-06-27 ENCOUNTER — Encounter: Payer: Self-pay | Admitting: Internal Medicine

## 2017-07-02 ENCOUNTER — Encounter: Payer: Self-pay | Admitting: Family

## 2017-07-04 DIAGNOSIS — R140 Abdominal distension (gaseous): Secondary | ICD-10-CM | POA: Diagnosis not present

## 2017-07-04 DIAGNOSIS — R198 Other specified symptoms and signs involving the digestive system and abdomen: Secondary | ICD-10-CM | POA: Diagnosis not present

## 2017-07-07 DIAGNOSIS — R300 Dysuria: Secondary | ICD-10-CM | POA: Diagnosis not present

## 2017-07-07 DIAGNOSIS — R319 Hematuria, unspecified: Secondary | ICD-10-CM | POA: Diagnosis not present

## 2017-08-02 ENCOUNTER — Encounter: Payer: Self-pay | Admitting: Internal Medicine

## 2017-08-25 DIAGNOSIS — R300 Dysuria: Secondary | ICD-10-CM | POA: Diagnosis not present

## 2017-08-25 DIAGNOSIS — N390 Urinary tract infection, site not specified: Secondary | ICD-10-CM | POA: Diagnosis not present

## 2017-08-27 DIAGNOSIS — F5104 Psychophysiologic insomnia: Secondary | ICD-10-CM | POA: Diagnosis not present

## 2017-08-27 DIAGNOSIS — G44221 Chronic tension-type headache, intractable: Secondary | ICD-10-CM | POA: Diagnosis not present

## 2017-08-27 DIAGNOSIS — G4752 REM sleep behavior disorder: Secondary | ICD-10-CM | POA: Diagnosis not present

## 2017-08-27 DIAGNOSIS — G44309 Post-traumatic headache, unspecified, not intractable: Secondary | ICD-10-CM | POA: Diagnosis not present

## 2017-09-03 ENCOUNTER — Ambulatory Visit: Payer: BLUE CROSS/BLUE SHIELD | Admitting: Family

## 2017-10-22 ENCOUNTER — Other Ambulatory Visit: Payer: Self-pay | Admitting: Family

## 2017-10-22 DIAGNOSIS — F411 Generalized anxiety disorder: Secondary | ICD-10-CM

## 2017-10-24 ENCOUNTER — Encounter: Payer: Self-pay | Admitting: Family

## 2017-10-24 ENCOUNTER — Other Ambulatory Visit: Payer: Self-pay | Admitting: Family

## 2017-10-24 DIAGNOSIS — F411 Generalized anxiety disorder: Secondary | ICD-10-CM

## 2017-10-24 MED ORDER — SERTRALINE HCL 50 MG PO TABS: 100.0000 mg | ORAL_TABLET | Freq: Every day | ORAL | 1 refills | Status: DC

## 2017-10-24 NOTE — Progress Notes (Signed)
close

## 2017-11-02 ENCOUNTER — Ambulatory Visit: Payer: BLUE CROSS/BLUE SHIELD | Admitting: Family

## 2017-11-07 ENCOUNTER — Telehealth: Payer: Self-pay | Admitting: Family

## 2017-11-07 NOTE — Telephone Encounter (Signed)
Call pt Unread mychart message  Ms Dawn, [name] are fine to increase the zoloft from 50mg  at bedtime to 100mg  at bedtime. However it has almost been a year since I have seen you last July. In order for me to continue medications, I have to see folks every year.   Please a follow up appointment ( may call office) in a couple of months.   I sent in the increased dose of zoloft to your pharmacy ( [facility]) for you.   Joycelyn Schmid

## 2017-11-08 ENCOUNTER — Telehealth: Payer: Self-pay | Admitting: Internal Medicine

## 2017-11-08 DIAGNOSIS — R1084 Generalized abdominal pain: Secondary | ICD-10-CM

## 2017-11-08 NOTE — Telephone Encounter (Unsigned)
Copied from [place] [id number]. Topic: Quick Communication - See Telephone Encounter >> Nov 08, 2017 10:43 AM Neva Seat wrote: Pt has been having stomach issues that she thought was due to some medication she was taking.  She stopped w/ the Rx and is still having the issues. Pt wants to see Dr. Derrel Nip asap.  Pt wants to be worked in w/ Dr. Derrel Nip.

## 2017-11-08 NOTE — Telephone Encounter (Signed)
Would it be okay to schedule pt tomorrow at 11:30 or 4:30pm for abdominal pain.

## 2017-11-08 NOTE — Telephone Encounter (Signed)
Called patient and she states that she has been having abdominal pain off and on for a few days. She states it feel achy ( like her stomach is upset). No complaints of diarehha or vomiting nor does she have any constipation.She does sometimes have gas.She states that her OB/GYN started her on Saxenda 4 weeks ago and she stopped on last Wednesday thinking the medication was the issue but she is still have the abdominal pain. Please advise

## 2017-11-08 NOTE — Telephone Encounter (Signed)
11:30  Send her to the lab first so please  tell her to come early  Labs ordered,  Needs a poct ua as well

## 2017-11-09 NOTE — Telephone Encounter (Signed)
Patient advised of below , she is coming to see Dr Derrel Nip on Tuesday for acute issue. Then will follow up with on Friday.

## 2017-11-09 NOTE — Telephone Encounter (Signed)
Pt stated that she can not come in today due to not being able to get off work. Pt has been scheduled for Tuesday.

## 2017-11-13 ENCOUNTER — Other Ambulatory Visit (INDEPENDENT_AMBULATORY_CARE_PROVIDER_SITE_OTHER): Payer: BLUE CROSS/BLUE SHIELD

## 2017-11-13 ENCOUNTER — Encounter: Payer: Self-pay | Admitting: Internal Medicine

## 2017-11-13 ENCOUNTER — Ambulatory Visit: Payer: BLUE CROSS/BLUE SHIELD | Admitting: Internal Medicine

## 2017-11-13 VITALS — BP 136/82 | HR 77 | Temp 98.4°F | Resp 15 | Ht 66.0 in | Wt 175.8 lb

## 2017-11-13 DIAGNOSIS — R1084 Generalized abdominal pain: Secondary | ICD-10-CM

## 2017-11-13 DIAGNOSIS — K581 Irritable bowel syndrome with constipation: Secondary | ICD-10-CM

## 2017-11-13 DIAGNOSIS — R1010 Upper abdominal pain, unspecified: Secondary | ICD-10-CM | POA: Diagnosis not present

## 2017-11-13 LAB — COMPREHENSIVE METABOLIC PANEL
ALBUMIN: 3.8 g/dL (ref 3.5–5.2)
ALK PHOS: 50 U/L (ref 39–117)
ALT: 14 U/L (ref 0–35)
AST: 17 U/L (ref 0–37)
BUN: 15 mg/dL (ref 6–23)
CALCIUM: 8.7 mg/dL (ref 8.4–10.5)
CHLORIDE: 105 meq/L (ref 96–112)
CO2: 24 mEq/L (ref 19–32)
Creatinine, Ser: 0.66 mg/dL (ref 0.40–1.20)
GFR: 105.44 mL/min (ref >60.00)
Glucose, Bld: 101 mg/dL — ABNORMAL HIGH (ref 70–99)
POTASSIUM: 3.7 meq/L (ref 3.5–5.1)
Sodium: 138 mEq/L (ref 135–145)
TOTAL PROTEIN: 6.5 g/dL (ref 6.0–8.3)
Total Bilirubin: 0.3 mg/dL (ref 0.2–1.2)

## 2017-11-13 LAB — POCT URINALYSIS DIPSTICK
GLUCOSE UA: NEGATIVE
Ketones, UA: NEGATIVE
LEUKOCYTES UA: NEGATIVE
Nitrite, UA: NEGATIVE
Protein, UA: POSITIVE — AB
Spec Grav, UA: >=1.03 — AB (ref 1.010–1.025)
Urobilinogen, UA: 0.2 E.U./dL
pH, UA: 5.5 (ref 5.0–8.0)

## 2017-11-13 LAB — URINALYSIS, MICROSCOPIC ONLY: RBC / HPF: NONE SEEN (ref 0–?)

## 2017-11-13 LAB — LIPASE: LIPASE: 30 U/L (ref 11.0–59.0)

## 2017-11-13 MED ORDER — LUBIPROSTONE 8 MCG PO CAPS: 8.0000 ug | ORAL_CAPSULE | Freq: Two times a day (BID) | ORAL | 0 refills | Status: DC

## 2017-11-13 NOTE — Patient Instructions (Signed)
I am recommending a trial of a medication called Amitiza ; it is used for treatment of constipation predominant IBS and, at higher doses,  For chronic constipation   Start the dose at 8 mg once or twice daily  Increase to 16 mg after 4 days if no improvement in symptoms  Max dose is 24 mg twice daily   RTC  One month
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
## 2017-11-14 LAB — URINE CULTURE
MICRO NUMBER:: 90640209
SPECIMEN QUALITY: ADEQUATE

## 2017-11-14 NOTE — Assessment & Plan Note (Addendum)
Current symptoms  And normal labs suggest IBS with bloating and constipation . Trial of amitiza

## 2017-11-16 ENCOUNTER — Ambulatory Visit: Payer: BLUE CROSS/BLUE SHIELD | Admitting: Family

## 2017-12-07 ENCOUNTER — Other Ambulatory Visit: Payer: Self-pay

## 2017-12-07 ENCOUNTER — Ambulatory Visit: Payer: BLUE CROSS/BLUE SHIELD | Admitting: Family

## 2017-12-07 ENCOUNTER — Other Ambulatory Visit: Payer: Self-pay | Admitting: Internal Medicine

## 2017-12-07 ENCOUNTER — Encounter: Payer: Self-pay | Admitting: Family

## 2017-12-07 ENCOUNTER — Encounter: Payer: Self-pay | Admitting: Internal Medicine

## 2017-12-07 ENCOUNTER — Ambulatory Visit (INDEPENDENT_AMBULATORY_CARE_PROVIDER_SITE_OTHER): Payer: BLUE CROSS/BLUE SHIELD

## 2017-12-07 DIAGNOSIS — R1010 Upper abdominal pain, unspecified: Secondary | ICD-10-CM

## 2017-12-07 DIAGNOSIS — F411 Generalized anxiety disorder: Secondary | ICD-10-CM | POA: Diagnosis not present

## 2017-12-07 DIAGNOSIS — K5900 Constipation, unspecified: Secondary | ICD-10-CM | POA: Diagnosis not present

## 2017-12-07 MED ORDER — LACTULOSE 20 GM/30ML PO SOLN: ORAL | 3 refills | Status: DC

## 2017-12-07 MED ORDER — SERTRALINE HCL 50 MG PO TABS: 100.0000 mg | ORAL_TABLET | Freq: Every day | ORAL | 1 refills | Status: DC
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
## 2017-12-07 NOTE — Assessment & Plan Note (Signed)
Unchanged.  Primarily situational driving.  Trial increase Zoloft 100 mg.  Patient let me know how she is doing.

## 2017-12-07 NOTE — Patient Instructions (Signed)
Trial of zoloft increase to 100mg  Let me know how you are doing

## 2017-12-10 ENCOUNTER — Other Ambulatory Visit: Payer: Self-pay | Admitting: Internal Medicine

## 2017-12-10 DIAGNOSIS — R5383 Other fatigue: Secondary | ICD-10-CM | POA: Diagnosis not present

## 2017-12-10 DIAGNOSIS — R635 Abnormal weight gain: Secondary | ICD-10-CM | POA: Diagnosis not present

## 2017-12-10 DIAGNOSIS — Z8659 Personal history of other mental and behavioral disorders: Secondary | ICD-10-CM | POA: Diagnosis not present

## 2017-12-10 MED ORDER — LUBIPROSTONE 24 MCG PO CAPS: 24.0000 ug | ORAL_CAPSULE | Freq: Two times a day (BID) | ORAL | 0 refills | Status: DC

## 2017-12-10 MED ORDER — LUBIPROSTONE 8 MCG PO CAPS: 24.0000 ug | ORAL_CAPSULE | Freq: Two times a day (BID) | ORAL | 0 refills | Status: DC

## 2017-12-27 DIAGNOSIS — N898 Other specified noninflammatory disorders of vagina: Secondary | ICD-10-CM | POA: Diagnosis not present

## 2018-02-04 ENCOUNTER — Other Ambulatory Visit: Payer: Self-pay | Admitting: Internal Medicine

## 2018-02-26 DIAGNOSIS — G4752 REM sleep behavior disorder: Secondary | ICD-10-CM | POA: Diagnosis not present

## 2018-02-26 DIAGNOSIS — F5104 Psychophysiologic insomnia: Secondary | ICD-10-CM | POA: Diagnosis not present

## 2018-02-26 DIAGNOSIS — G44221 Chronic tension-type headache, intractable: Secondary | ICD-10-CM | POA: Diagnosis not present

## 2018-02-26 DIAGNOSIS — G44309 Post-traumatic headache, unspecified, not intractable: Secondary | ICD-10-CM | POA: Diagnosis not present

## 2018-03-01 ENCOUNTER — Other Ambulatory Visit: Payer: Self-pay | Admitting: Internal Medicine

## 2018-03-01 DIAGNOSIS — K581 Irritable bowel syndrome with constipation: Secondary | ICD-10-CM

## 2018-03-01 NOTE — Assessment & Plan Note (Signed)
amitiza failure. Using sennakot in excess ,  Trial of linzess

## 2018-03-02 ENCOUNTER — Other Ambulatory Visit: Payer: Self-pay | Admitting: Internal Medicine

## 2018-03-02 DIAGNOSIS — K5909 Other constipation: Secondary | ICD-10-CM | POA: Insufficient documentation

## 2018-03-02 MED ORDER — LINACLOTIDE 290 MCG PO CAPS: 290.0000 ug | ORAL_CAPSULE | Freq: Every day | ORAL | 11 refills | Status: DC

## 2018-03-02 MED ORDER — LACTULOSE 20 GM/30ML PO SOLN: ORAL | 3 refills | Status: DC

## 2018-03-02 NOTE — Assessment & Plan Note (Signed)
No improvemement with amitiza.  Abusing laxatives.  Trial of linzess and lactulose, and advised to suspend stimulatn laxative

## 2018-03-14 ENCOUNTER — Telehealth: Payer: Self-pay

## 2018-03-14 DIAGNOSIS — K5909 Other constipation: Secondary | ICD-10-CM

## 2018-03-14 NOTE — Telephone Encounter (Signed)
Copied from [place] [phone]. Topic: Referral - Request >> Mar 14, 2018 11:09 AM Marja Kays F wrote: Pt states that the provider was going to recommend that she be referred to a GI office and the patient would prefer to go to [place] the fax number is [phone]  Best number [phone]

## 2018-03-15 NOTE — Addendum Note (Signed)
Addended by: Crecencio Mc on: 03/15/2018 05:41 PM   Modules accepted: Orders

## 2018-03-27 DIAGNOSIS — K5900 Constipation, unspecified: Secondary | ICD-10-CM | POA: Diagnosis not present

## 2018-03-27 DIAGNOSIS — R194 Change in bowel habit: Secondary | ICD-10-CM | POA: Diagnosis not present

## 2018-03-29 DIAGNOSIS — K649 Unspecified hemorrhoids: Secondary | ICD-10-CM | POA: Diagnosis not present

## 2018-03-29 DIAGNOSIS — Z8543 Personal history of malignant neoplasm of ovary: Secondary | ICD-10-CM | POA: Diagnosis not present

## 2018-03-29 DIAGNOSIS — R194 Change in bowel habit: Secondary | ICD-10-CM | POA: Diagnosis not present

## 2018-03-29 DIAGNOSIS — K5900 Constipation, unspecified: Secondary | ICD-10-CM | POA: Diagnosis not present

## 2018-03-29 DIAGNOSIS — E559 Vitamin D deficiency, unspecified: Secondary | ICD-10-CM | POA: Diagnosis not present

## 2018-03-29 DIAGNOSIS — R1030 Lower abdominal pain, unspecified: Secondary | ICD-10-CM | POA: Diagnosis not present

## 2018-03-29 DIAGNOSIS — K644 Residual hemorrhoidal skin tags: Secondary | ICD-10-CM | POA: Diagnosis not present

## 2018-03-29 DIAGNOSIS — K6389 Other specified diseases of intestine: Secondary | ICD-10-CM | POA: Diagnosis not present

## 2018-03-29 DIAGNOSIS — Z79899 Other long term (current) drug therapy: Secondary | ICD-10-CM | POA: Diagnosis not present

## 2018-03-29 LAB — HM COLONOSCOPY

## 2018-04-09 ENCOUNTER — Other Ambulatory Visit: Payer: Self-pay | Admitting: Physician Assistant

## 2018-04-09 DIAGNOSIS — R1031 Right lower quadrant pain: Secondary | ICD-10-CM

## 2018-04-09 DIAGNOSIS — R7302 Impaired glucose tolerance (oral): Secondary | ICD-10-CM | POA: Diagnosis not present

## 2018-04-09 DIAGNOSIS — F5104 Psychophysiologic insomnia: Secondary | ICD-10-CM | POA: Diagnosis not present

## 2018-04-09 DIAGNOSIS — K5900 Constipation, unspecified: Secondary | ICD-10-CM

## 2018-04-09 DIAGNOSIS — K581 Irritable bowel syndrome with constipation: Secondary | ICD-10-CM | POA: Diagnosis not present

## 2018-04-09 DIAGNOSIS — E559 Vitamin D deficiency, unspecified: Secondary | ICD-10-CM | POA: Diagnosis not present

## 2018-04-17 ENCOUNTER — Ambulatory Visit
Admission: RE | Admit: 2018-04-17 | Discharge: 2018-04-17 | Disposition: A | Discharge status: Home / Self Care | Payer: BLUE CROSS/BLUE SHIELD | Source: Ambulatory Visit | Attending: Physician Assistant | Admitting: Physician Assistant

## 2018-04-17 DIAGNOSIS — K439 Ventral hernia without obstruction or gangrene: Secondary | ICD-10-CM | POA: Insufficient documentation

## 2018-04-17 DIAGNOSIS — R1031 Right lower quadrant pain: Secondary | ICD-10-CM | POA: Insufficient documentation

## 2018-04-17 DIAGNOSIS — K5900 Constipation, unspecified: Secondary | ICD-10-CM | POA: Insufficient documentation

## 2018-04-17 MED ORDER — IOPAMIDOL (ISOVUE-300) INJECTION 61%
100.0000 mL | Freq: Once | INTRAVENOUS | Status: AC | PRN
  Administered 2018-04-17: 100 mL via INTRAVENOUS

## 2018-07-10 DIAGNOSIS — E663 Overweight: Secondary | ICD-10-CM | POA: Diagnosis not present

## 2018-07-10 DIAGNOSIS — K581 Irritable bowel syndrome with constipation: Secondary | ICD-10-CM | POA: Diagnosis not present

## 2018-07-10 DIAGNOSIS — Z114 Encounter for screening for human immunodeficiency virus [HIV]: Secondary | ICD-10-CM | POA: Diagnosis not present

## 2018-07-10 DIAGNOSIS — R7302 Impaired glucose tolerance (oral): Secondary | ICD-10-CM | POA: Diagnosis not present

## 2018-07-15 DIAGNOSIS — Z01419 Encounter for gynecological examination (general) (routine) without abnormal findings: Secondary | ICD-10-CM | POA: Diagnosis not present

## 2018-07-15 DIAGNOSIS — C562 Malignant neoplasm of left ovary: Secondary | ICD-10-CM | POA: Diagnosis not present

## 2018-07-15 DIAGNOSIS — Z1239 Encounter for other screening for malignant neoplasm of breast: Secondary | ICD-10-CM | POA: Diagnosis not present

## 2018-07-15 DIAGNOSIS — Z113 Encounter for screening for infections with a predominantly sexual mode of transmission: Secondary | ICD-10-CM | POA: Diagnosis not present

## 2018-07-17 ENCOUNTER — Other Ambulatory Visit: Payer: Self-pay | Admitting: Obstetrics & Gynecology

## 2018-07-17 DIAGNOSIS — Z1231 Encounter for screening mammogram for malignant neoplasm of breast: Secondary | ICD-10-CM

## 2018-07-31 DIAGNOSIS — K5909 Other constipation: Secondary | ICD-10-CM | POA: Diagnosis not present

## 2018-07-31 DIAGNOSIS — R140 Abdominal distension (gaseous): Secondary | ICD-10-CM | POA: Diagnosis not present

## 2018-07-31 DIAGNOSIS — K5900 Constipation, unspecified: Secondary | ICD-10-CM | POA: Diagnosis not present

## 2018-08-08 ENCOUNTER — Inpatient Hospital Stay: Admission: RE | Admit: 2018-08-08 | Payer: BLUE CROSS/BLUE SHIELD | Source: Ambulatory Visit

## 2018-08-14 DIAGNOSIS — H6980 Other specified disorders of Eustachian tube, unspecified ear: Secondary | ICD-10-CM | POA: Diagnosis not present

## 2018-08-14 DIAGNOSIS — H90A32 Mixed conductive and sensorineural hearing loss, unilateral, left ear with restricted hearing on the contralateral side: Secondary | ICD-10-CM | POA: Diagnosis not present

## 2018-08-20 ENCOUNTER — Ambulatory Visit
Admission: RE | Admit: 2018-08-20 | Discharge: 2018-08-20 | Disposition: A | Discharge status: Home / Self Care | Payer: BLUE CROSS/BLUE SHIELD | Source: Ambulatory Visit | Attending: Obstetrics & Gynecology | Admitting: Obstetrics & Gynecology

## 2018-08-20 DIAGNOSIS — Z1231 Encounter for screening mammogram for malignant neoplasm of breast: Secondary | ICD-10-CM

## 2018-08-21 DIAGNOSIS — H906 Mixed conductive and sensorineural hearing loss, bilateral: Secondary | ICD-10-CM | POA: Diagnosis not present

## 2018-08-28 DIAGNOSIS — K581 Irritable bowel syndrome with constipation: Secondary | ICD-10-CM | POA: Diagnosis not present

## 2018-09-19 DIAGNOSIS — G43711 Chronic migraine without aura, intractable, with status migrainosus: Secondary | ICD-10-CM | POA: Diagnosis not present

## 2018-09-19 DIAGNOSIS — E559 Vitamin D deficiency, unspecified: Secondary | ICD-10-CM | POA: Diagnosis not present

## 2018-09-19 DIAGNOSIS — G4752 REM sleep behavior disorder: Secondary | ICD-10-CM | POA: Diagnosis not present

## 2018-10-01 DIAGNOSIS — K581 Irritable bowel syndrome with constipation: Secondary | ICD-10-CM | POA: Diagnosis not present

## 2018-10-10 DIAGNOSIS — K581 Irritable bowel syndrome with constipation: Secondary | ICD-10-CM | POA: Diagnosis not present

## 2018-10-10 DIAGNOSIS — R7302 Impaired glucose tolerance (oral): Secondary | ICD-10-CM | POA: Diagnosis not present

## 2018-10-10 DIAGNOSIS — F5104 Psychophysiologic insomnia: Secondary | ICD-10-CM | POA: Diagnosis not present

## 2018-10-10 DIAGNOSIS — G44221 Chronic tension-type headache, intractable: Secondary | ICD-10-CM | POA: Diagnosis not present

## 2018-10-11 DIAGNOSIS — J301 Allergic rhinitis due to pollen: Secondary | ICD-10-CM | POA: Diagnosis not present

## 2018-10-24 DIAGNOSIS — H6980 Other specified disorders of Eustachian tube, unspecified ear: Secondary | ICD-10-CM | POA: Diagnosis not present

## 2018-10-24 DIAGNOSIS — J300 Vasomotor rhinitis: Secondary | ICD-10-CM | POA: Diagnosis not present

## 2018-10-24 DIAGNOSIS — H906 Mixed conductive and sensorineural hearing loss, bilateral: Secondary | ICD-10-CM | POA: Diagnosis not present

## 2018-10-24 DIAGNOSIS — H90A32 Mixed conductive and sensorineural hearing loss, unilateral, left ear with restricted hearing on the contralateral side: Secondary | ICD-10-CM | POA: Diagnosis not present

## 2018-11-13 DIAGNOSIS — K581 Irritable bowel syndrome with constipation: Secondary | ICD-10-CM | POA: Diagnosis not present

## 2019-01-16 DIAGNOSIS — R399 Unspecified symptoms and signs involving the genitourinary system: Secondary | ICD-10-CM | POA: Diagnosis not present

## 2019-03-06 IMAGING — MG DIGITAL SCREENING BILATERAL MAMMOGRAM WITH TOMO AND CAD
8 series · 8 of 24 positions shown · non-contrast
Comparison: None.

CLINICAL DATA: Screening. Baseline.

EXAM:
DIGITAL SCREENING BILATERAL MAMMOGRAM WITH TOMO AND CAD

[R CC synth-2D]
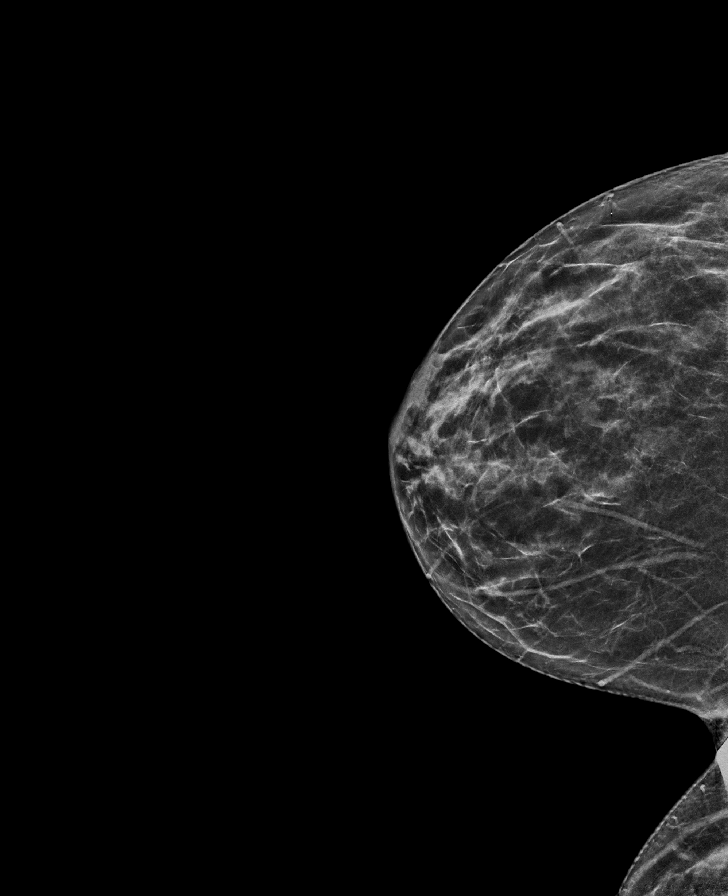

[R MLO synth-2D]
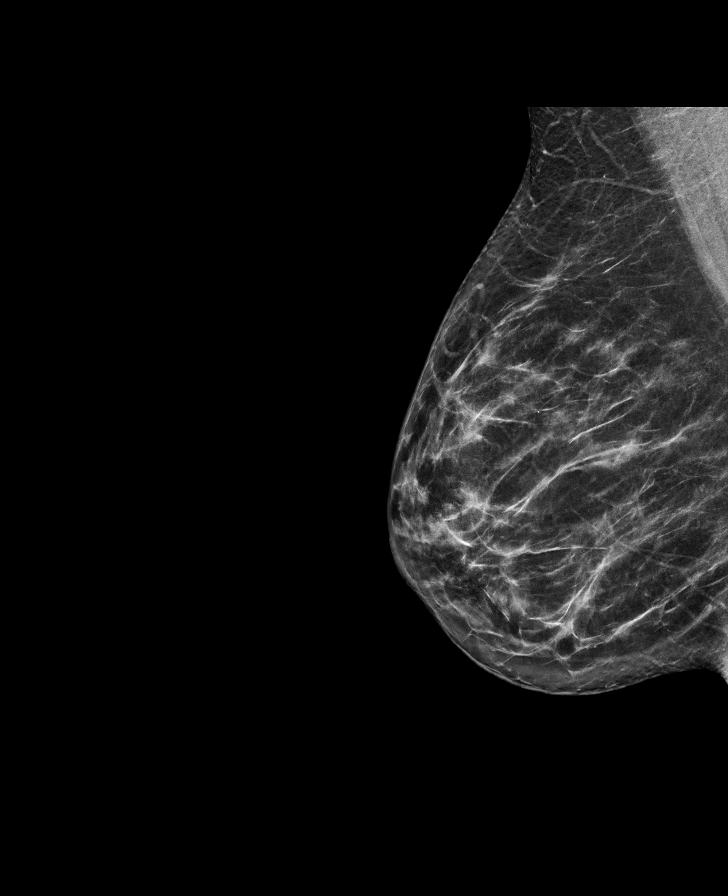

[L CC synth-2D]
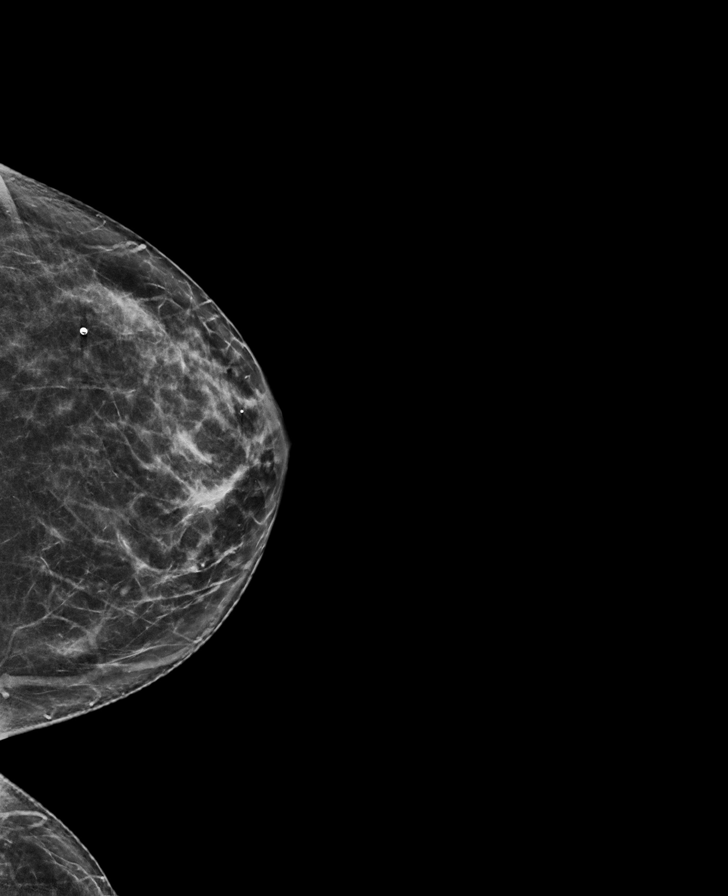

[L MLO synth-2D]
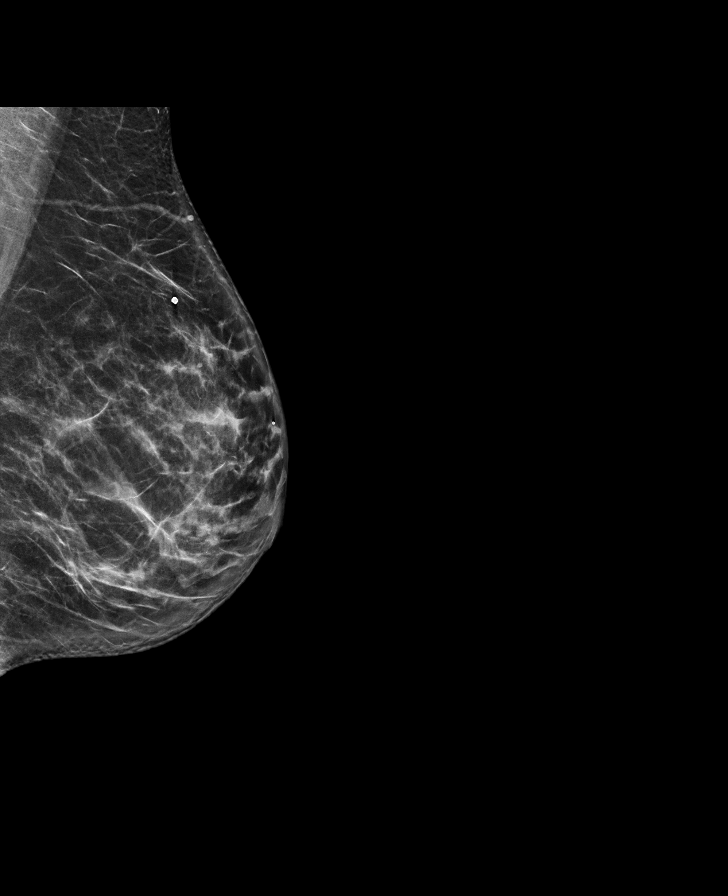

[R MLO tomo · tomo slice 35/70.0]
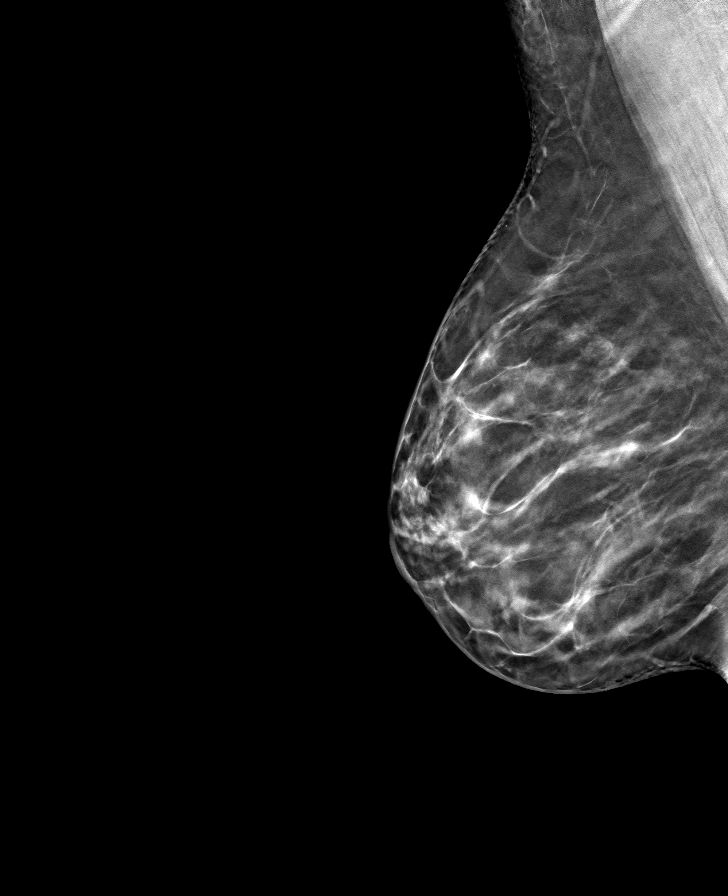

[R CC tomo · tomo slice 33/65.0]
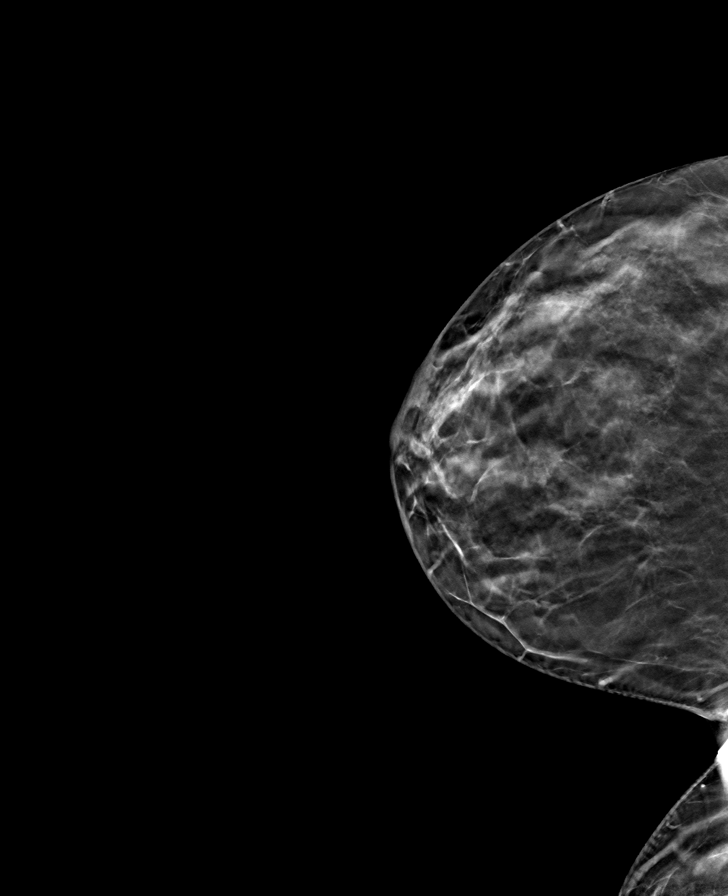

[L CC tomo · tomo slice 35/69.0]
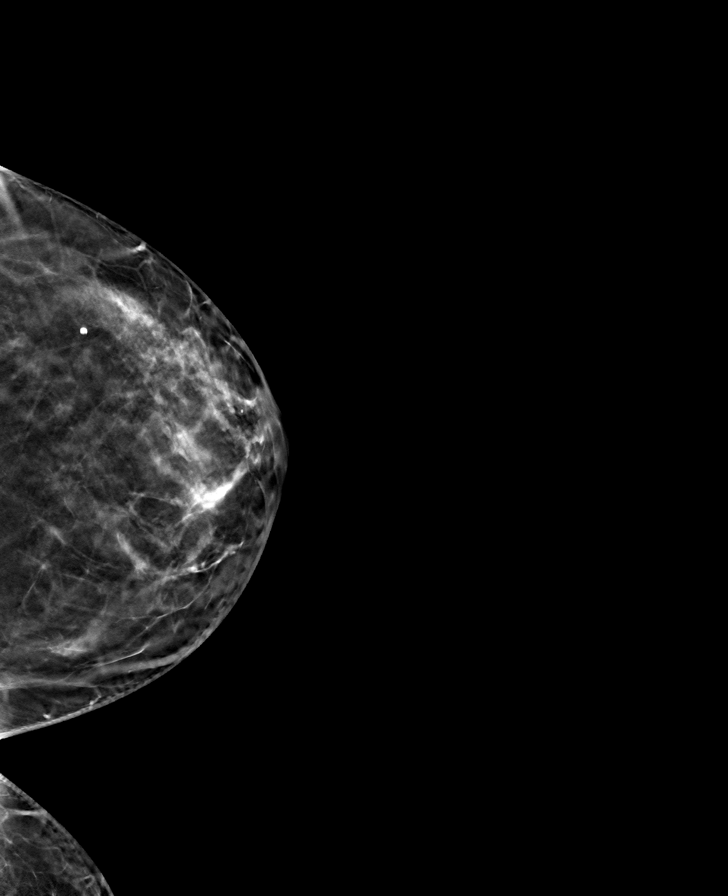

[L MLO tomo · tomo slice 36/71.0]
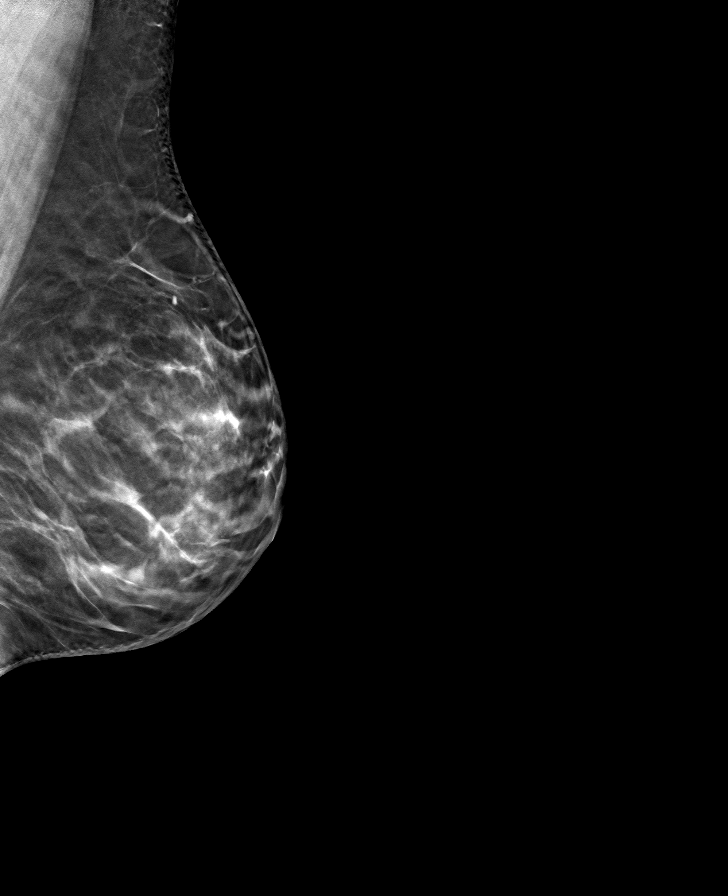

[8 of 24 positions shown; findings below may reference images not displayed]

ACR Breast Density Category c: The breast tissue is heterogeneously
dense, which may obscure small masses
FINDINGS: There are no findings suspicious for malignancy. Images were
processed with CAD.
IMPRESSION: No mammographic evidence of malignancy. A result letter of this
screening mammogram will be mailed directly to the patient.

RECOMMENDATION:
Screening mammogram in one year. (Code:EN-2-3B0)

BI-RADS CATEGORY  1: Negative.

## 2019-04-14 DIAGNOSIS — Z1159 Encounter for screening for other viral diseases: Secondary | ICD-10-CM | POA: Diagnosis not present

## 2019-04-14 DIAGNOSIS — R8290 Unspecified abnormal findings in urine: Secondary | ICD-10-CM | POA: Diagnosis not present

## 2019-04-14 DIAGNOSIS — R7302 Impaired glucose tolerance (oral): Secondary | ICD-10-CM | POA: Diagnosis not present

## 2019-04-14 DIAGNOSIS — K581 Irritable bowel syndrome with constipation: Secondary | ICD-10-CM | POA: Diagnosis not present

## 2019-04-14 DIAGNOSIS — E663 Overweight: Secondary | ICD-10-CM | POA: Diagnosis not present

## 2019-04-24 DIAGNOSIS — H6981 Other specified disorders of Eustachian tube, right ear: Secondary | ICD-10-CM | POA: Diagnosis not present

## 2019-05-07 DIAGNOSIS — G43711 Chronic migraine without aura, intractable, with status migrainosus: Secondary | ICD-10-CM | POA: Diagnosis not present

## 2019-05-07 DIAGNOSIS — G44309 Post-traumatic headache, unspecified, not intractable: Secondary | ICD-10-CM | POA: Diagnosis not present

## 2019-05-07 DIAGNOSIS — F5104 Psychophysiologic insomnia: Secondary | ICD-10-CM | POA: Diagnosis not present

## 2019-05-07 DIAGNOSIS — G4752 REM sleep behavior disorder: Secondary | ICD-10-CM | POA: Diagnosis not present

## 2019-06-04 DIAGNOSIS — H9210 Otorrhea, unspecified ear: Secondary | ICD-10-CM | POA: Diagnosis not present

## 2019-07-08 ENCOUNTER — Other Ambulatory Visit: Payer: Self-pay | Admitting: Gastroenterology

## 2019-07-08 ENCOUNTER — Other Ambulatory Visit: Payer: Self-pay

## 2019-07-08 ENCOUNTER — Ambulatory Visit
Admission: RE | Admit: 2019-07-08 | Discharge: 2019-07-08 | Disposition: A | Discharge status: Home / Self Care | Payer: BC Managed Care – PPO | Source: Ambulatory Visit | Attending: Gastroenterology | Admitting: Gastroenterology

## 2019-07-08 DIAGNOSIS — K5904 Chronic idiopathic constipation: Secondary | ICD-10-CM

## 2019-07-10 ENCOUNTER — Ambulatory Visit
Admission: RE | Admit: 2019-07-10 | Discharge: 2019-07-10 | Disposition: A | Discharge status: Home / Self Care | Payer: BC Managed Care – PPO | Source: Ambulatory Visit | Attending: Gastroenterology | Admitting: Gastroenterology

## 2019-07-10 ENCOUNTER — Other Ambulatory Visit: Payer: Self-pay | Admitting: Gastroenterology

## 2019-07-10 DIAGNOSIS — K5904 Chronic idiopathic constipation: Secondary | ICD-10-CM

## 2019-12-12 ENCOUNTER — Other Ambulatory Visit: Payer: Self-pay | Admitting: Physician Assistant

## 2019-12-12 DIAGNOSIS — M7542 Impingement syndrome of left shoulder: Secondary | ICD-10-CM

## 2019-12-31 ENCOUNTER — Ambulatory Visit
Admission: RE | Admit: 2019-12-31 | Discharge: 2019-12-31 | Disposition: A | Discharge status: Home / Self Care | Payer: BC Managed Care – PPO | Source: Ambulatory Visit | Attending: Physician Assistant | Admitting: Physician Assistant

## 2019-12-31 ENCOUNTER — Other Ambulatory Visit: Payer: Self-pay

## 2019-12-31 DIAGNOSIS — M7542 Impingement syndrome of left shoulder: Secondary | ICD-10-CM | POA: Insufficient documentation

## 2019-12-31 MED ORDER — SODIUM CHLORIDE (PF) 0.9 % IJ SOLN
15.0000 mL | Freq: Once | INTRAMUSCULAR | Status: AC
  Administered 2019-12-31: 15 mL

## 2019-12-31 MED ORDER — IOHEXOL 180 MG/ML  SOLN
5.0000 mL | Freq: Once | INTRAMUSCULAR | Status: AC | PRN
  Administered 2019-12-31: 5 mL

## 2019-12-31 MED ORDER — GADOBUTROL 1 MMOL/ML IV SOLN
0.1000 mL | Freq: Once | INTRAVENOUS | Status: AC | PRN
  Administered 2019-12-31: 0.1 mL

## 2019-12-31 MED ORDER — LIDOCAINE HCL (PF) 1 % IJ SOLN
5.0000 mL | Freq: Once | INTRAMUSCULAR | Status: AC
  Administered 2019-12-31: 5 mL via INTRADERMAL
  Filled 2019-12-31: qty 5

## 2020-01-24 IMAGING — CR DG ABDOMEN 2V
2 series · 2 of 2 positions shown · non-contrast
Comparison: 07/08/2019.

CLINICAL DATA: Constipation.  Evaluation cysts markers.

EXAM:
ABDOMEN - 2 VIEW

[w abdomen upright]
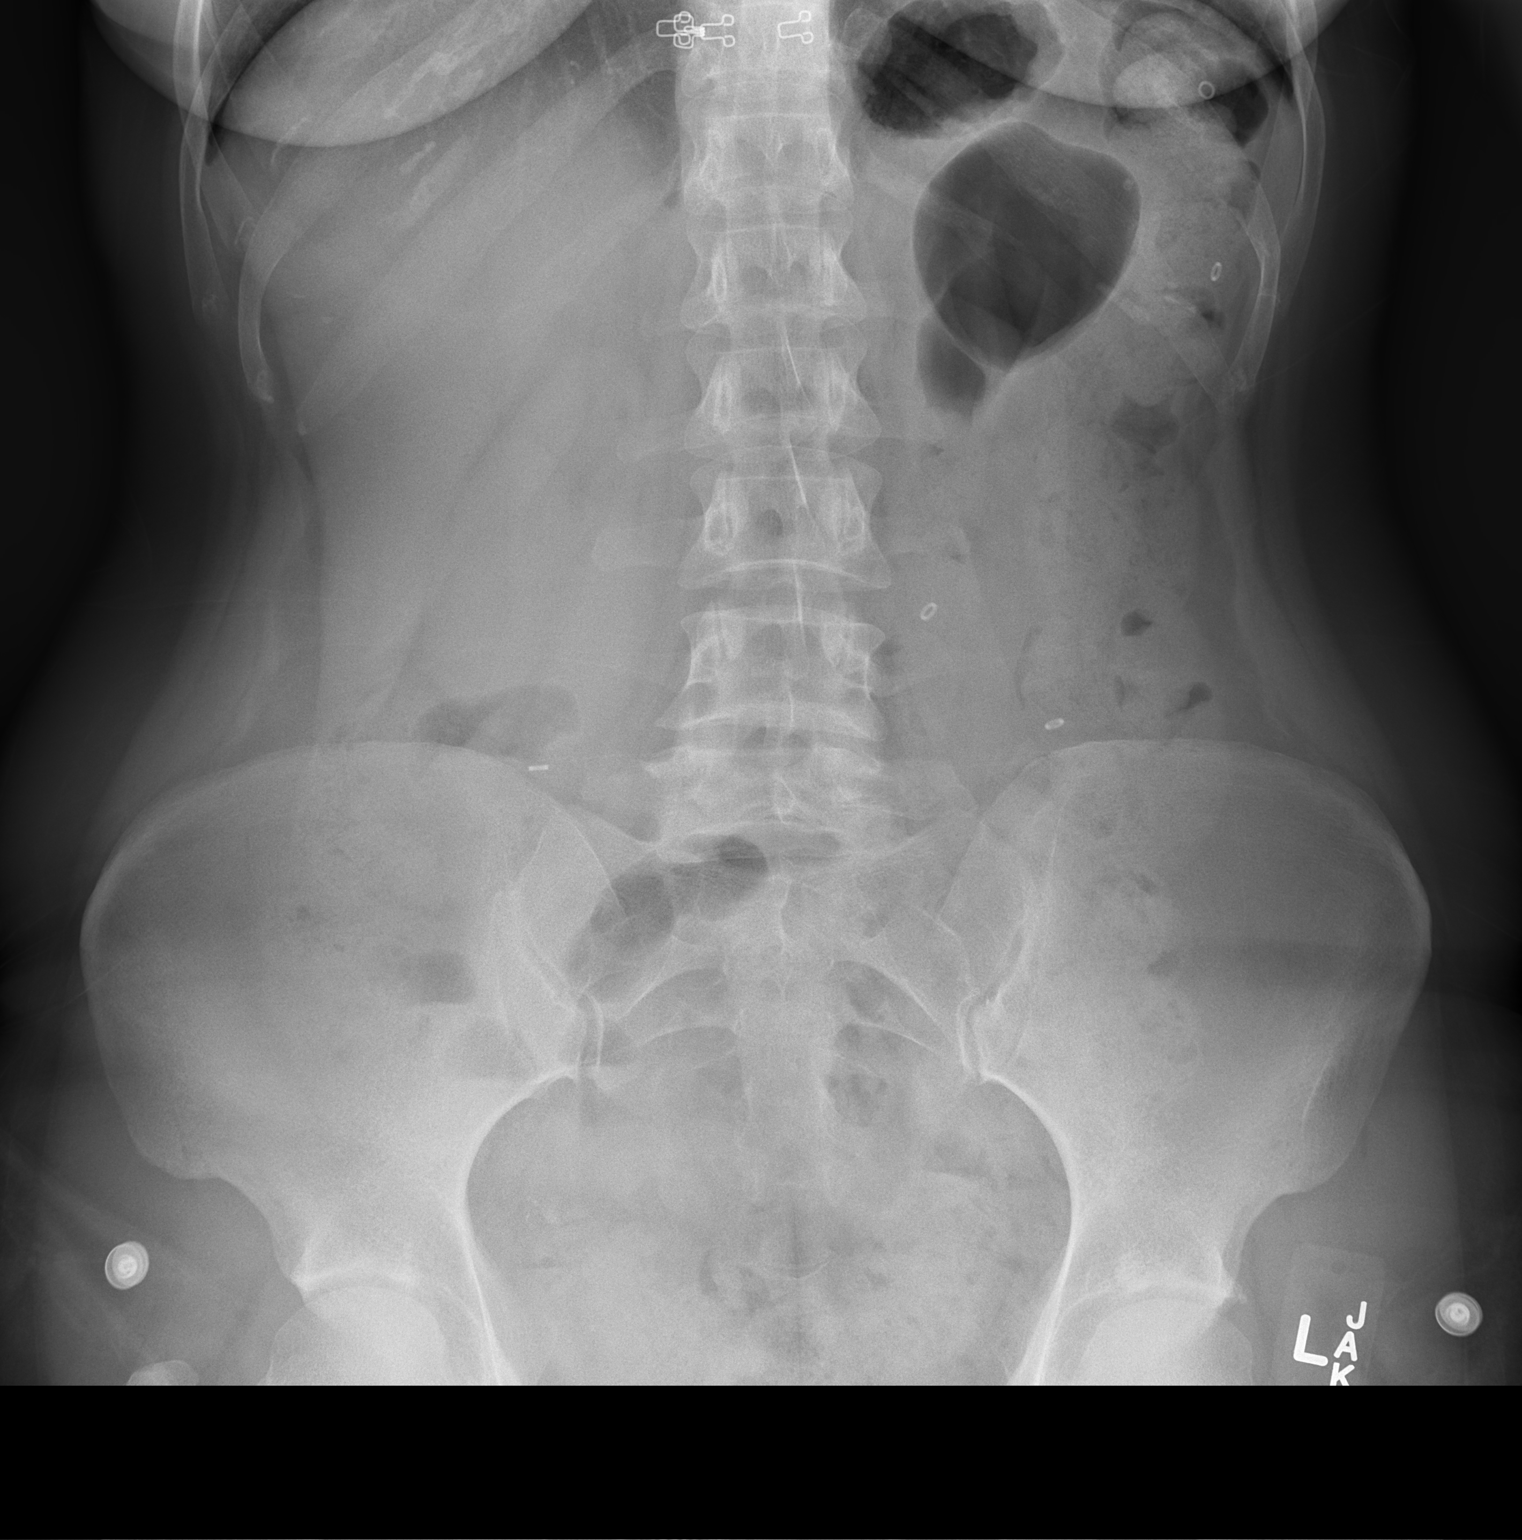

[t abdomen supine]
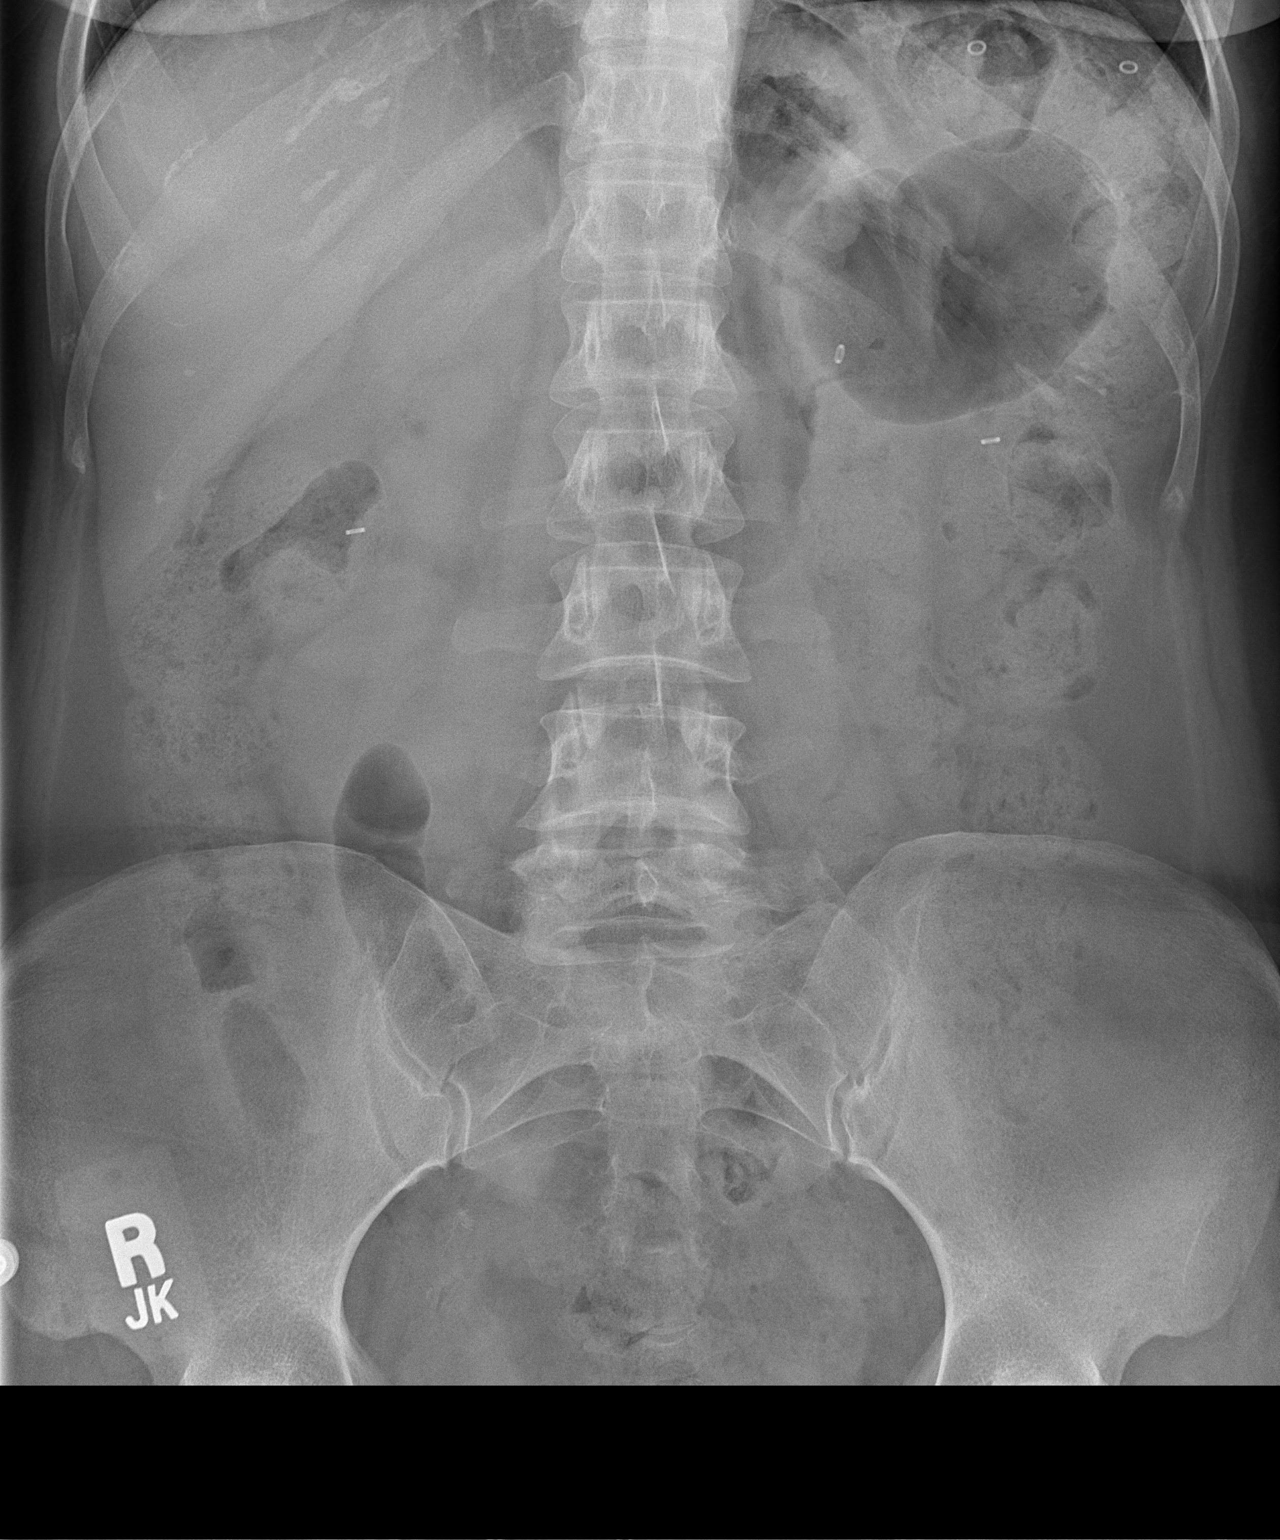

[2 of 2 positions shown; findings below may reference images not displayed]

FINDINGS: Interim decrease in number Sitz markers. Eight remaining Sitz
markers are noted. No bowel distention. Large amount of stool noted
throughout the colon. Degenerative change lumbar spine and both
hips.
IMPRESSION: Interim decrease in number Sitz markers. A remaining Sitz markers
are noted. No bowel distention. Large amount of stool noted
throughout the colon.

## 2020-08-24 ENCOUNTER — Other Ambulatory Visit: Payer: Self-pay | Admitting: Podiatry

## 2020-08-24 DIAGNOSIS — M79672 Pain in left foot: Secondary | ICD-10-CM

## 2020-08-24 DIAGNOSIS — D492 Neoplasm of unspecified behavior of bone, soft tissue, and skin: Secondary | ICD-10-CM

## 2020-09-07 ENCOUNTER — Ambulatory Visit
Admission: RE | Admit: 2020-09-07 | Discharge: 2020-09-07 | Disposition: A | Discharge status: Home / Self Care | Payer: BC Managed Care – PPO | Source: Ambulatory Visit | Attending: Podiatry | Admitting: Podiatry

## 2020-09-07 ENCOUNTER — Other Ambulatory Visit: Payer: Self-pay

## 2020-09-07 DIAGNOSIS — D492 Neoplasm of unspecified behavior of bone, soft tissue, and skin: Secondary | ICD-10-CM | POA: Insufficient documentation

## 2020-09-07 DIAGNOSIS — M79672 Pain in left foot: Secondary | ICD-10-CM | POA: Insufficient documentation

## 2020-09-07 MED ORDER — GADOBUTROL 1 MMOL/ML IV SOLN
7.5000 mL | Freq: Once | INTRAVENOUS | Status: AC | PRN
  Administered 2020-09-07: 7.5 mL via INTRAVENOUS

## 2020-09-17 ENCOUNTER — Other Ambulatory Visit: Payer: Self-pay | Admitting: Podiatry

## 2020-09-24 ENCOUNTER — Other Ambulatory Visit: Payer: BC Managed Care – PPO

## 2020-09-29 ENCOUNTER — Other Ambulatory Visit: Payer: BC Managed Care – PPO

## 2020-10-05 ENCOUNTER — Other Ambulatory Visit: Payer: Self-pay

## 2020-10-05 ENCOUNTER — Other Ambulatory Visit
Admission: RE | Admit: 2020-10-05 | Discharge: 2020-10-05 | Disposition: A | Discharge status: Home / Self Care | Payer: BC Managed Care – PPO | Source: Ambulatory Visit | Attending: Podiatry | Admitting: Podiatry

## 2020-10-05 DIAGNOSIS — Z01818 Encounter for other preprocedural examination: Secondary | ICD-10-CM | POA: Insufficient documentation

## 2020-10-05 HISTORY — DX: Headache, unspecified: R51.9

## 2020-10-05 HISTORY — DX: Anemia, unspecified: D64.9

## 2020-10-05 HISTORY — DX: Unspecified asthma, uncomplicated: J45.909

## 2020-10-05 HISTORY — DX: Nausea with vomiting, unspecified: R11.2

## 2020-10-05 HISTORY — DX: Other specified postprocedural states: Z98.890

## 2020-10-05 HISTORY — DX: Personal history of urinary calculi: Z87.442

## 2020-10-05 HISTORY — DX: Family history of other specified conditions: Z84.89

## 2020-10-05 HISTORY — DX: Anxiety disorder, unspecified: F41.9

## 2020-10-05 HISTORY — DX: Other complications of anesthesia, initial encounter: T88.59XA

## 2020-10-05 NOTE — Patient Instructions (Addendum)
Your procedure is scheduled on: 10/15/20 - Friday Report to the Registration Desk on the 1st floor of the [facility]. To find out your arrival time, please call [phone] between 1PM - 3PM on: 10/14/20 - Thursday  [facility] on 10/13/20   REMEMBER: Instructions that are not followed completely may result in serious medical risk, up to and including death; or upon the discretion of your surgeon and anesthesiologist your surgery may need to be rescheduled.  Do not eat food after midnight the night before surgery.  No gum chewing, lozengers or hard candies.  You may however, drink CLEAR liquids up to 2 hours before you are scheduled to arrive for your surgery. Do not drink anything within 2 hours of your scheduled arrival time.  Clear liquids include: - water  - apple juice without pulp - gatorade (not RED, PURPLE, OR BLUE) - black coffee or tea (Do NOT add milk or creamers to the coffee or tea) Do NOT drink anything that is not on this list.  Type 1 and Type 2 diabetics should only drink water.  In addition, your doctor has ordered for you to drink the provided  Ensure Pre-Surgery Clear Carbohydrate Drink  Drinking this carbohydrate drink up to two hours before surgery helps to reduce insulin resistance and improve patient outcomes. Please complete drinking 2 hours prior to scheduled arrival time.  TAKE THESE MEDICATIONS THE MORNING OF SURGERY WITH A SIP OF WATER:  - topiramate (TOPAMAX) 25 MG tablet - Magnesium Oxide -Mg Supplement 250 MG TABS  One week prior to surgery: Stop Anti-inflammatories (NSAIDS) such as Advil, Aleve, Ibuprofen, Motrin, Naproxen, Naprosyn and Aspirin based products such as Excedrin, Goodys Powder, BC Powder.  Stop beginning 10/07/20 ANY OVER THE COUNTER supplements until after surgery. Any questions or concerns about these medications, please notify your prescribing physician.  No Alcohol for 24 hours before or after surgery.  No Smoking  including e-cigarettes for 24 hours prior to surgery.  No chewable tobacco products for at least 6 hours prior to surgery.  No nicotine patches on the day of surgery.  Do not use any "recreational" drugs for at least a week prior to your surgery.  Please be advised that the combination of cocaine and anesthesia may have negative outcomes, up to and including death. If you test positive for cocaine, your surgery will be cancelled.  On the morning of surgery brush your teeth with toothpaste and water, you may rinse your mouth with mouthwash if you wish. Do not swallow any toothpaste or mouthwash.  Do not wear jewelry, make-up, hairpins, clips or nail polish.  Do not wear lotions, powders, or perfumes.   Do not shave body from the neck down 48 hours prior to surgery just in case you cut yourself which could leave a site for infection.  Also, freshly shaved skin may become irritated if using the CHG soap.  Contact lenses, hearing aids and dentures may not be worn into surgery.  Do not bring valuables to the hospital. [facility] is not responsible for any missing/lost belongings or valuables.   Use CHG Soap or wipes as directed on instruction sheet.  Notify your doctor if there is any change in your medical condition (cold, fever, infection).  Wear comfortable clothing (specific to your surgery type) to the hospital.  Plan for stool softeners for home use; pain medications have a tendency to cause constipation. You can also help prevent constipation by eating foods high in fiber such as fruits  and vegetables and drinking plenty of fluids as your diet allows.  After surgery, you can help prevent lung complications by doing breathing exercises.  Take deep breaths and cough every 1-2 hours. Your doctor may order a device called an Incentive Spirometer to help you take deep breaths. When coughing or sneezing, hold a pillow firmly against your incision with both hands. This is called  "splinting." Doing this helps protect your incision. It also decreases belly discomfort.  If you are being admitted to the hospital overnight, leave your suitcase in the car. After surgery it may be brought to your room.  If you are being discharged the day of surgery, you will not be allowed to drive home. You will need a responsible adult (18 years or older) to drive you home and stay with you that night.   If you are taking public transportation, you will need to have a responsible adult (18 years or older) with you. Please confirm with your physician that it is acceptable to use public transportation.   Please call the [facility] at [phone] if you have any questions about these instructions.  Surgery Visitation Policy:  Patients undergoing a surgery or procedure may have one family member or support person with them as long as that person is not COVID-19 positive or experiencing its symptoms.  That person may remain in the waiting area during the procedure.  Inpatient Visitation:    Visiting hours are 7 a.m. to 8 p.m. Inpatients will be allowed two visitors daily. The visitors may change each day during the patient's stay. No visitors under the age of 45. Any visitor under the age of 15 must be accompanied by an adult. The visitor must pass COVID-19 screenings, use hand sanitizer when entering and exiting the patient's room and wear a mask at all times, including in the patient's room. Patients must also wear a mask when staff or their visitor are in the room. Masking is required regardless of vaccination status.

## 2020-10-13 ENCOUNTER — Other Ambulatory Visit
Admission: RE | Admit: 2020-10-13 | Discharge: 2020-10-13 | Disposition: A | Discharge status: Home / Self Care | Payer: BC Managed Care – PPO | Source: Ambulatory Visit | Attending: Podiatry | Admitting: Podiatry

## 2020-10-13 ENCOUNTER — Other Ambulatory Visit: Payer: Self-pay

## 2020-10-13 DIAGNOSIS — Z792 Long term (current) use of antibiotics: Secondary | ICD-10-CM | POA: Diagnosis not present

## 2020-10-13 DIAGNOSIS — Z01812 Encounter for preprocedural laboratory examination: Secondary | ICD-10-CM | POA: Insufficient documentation

## 2020-10-13 DIAGNOSIS — Z886 Allergy status to analgesic agent status: Secondary | ICD-10-CM | POA: Diagnosis not present

## 2020-10-13 DIAGNOSIS — Z79890 Hormone replacement therapy (postmenopausal): Secondary | ICD-10-CM | POA: Diagnosis not present

## 2020-10-13 DIAGNOSIS — G43909 Migraine, unspecified, not intractable, without status migrainosus: Secondary | ICD-10-CM | POA: Diagnosis not present

## 2020-10-13 DIAGNOSIS — Z8543 Personal history of malignant neoplasm of ovary: Secondary | ICD-10-CM | POA: Diagnosis not present

## 2020-10-13 DIAGNOSIS — Z882 Allergy status to sulfonamides status: Secondary | ICD-10-CM | POA: Diagnosis not present

## 2020-10-13 DIAGNOSIS — D1801 Hemangioma of skin and subcutaneous tissue: Secondary | ICD-10-CM | POA: Diagnosis present

## 2020-10-13 DIAGNOSIS — G4700 Insomnia, unspecified: Secondary | ICD-10-CM | POA: Diagnosis not present

## 2020-10-13 DIAGNOSIS — E538 Deficiency of other specified B group vitamins: Secondary | ICD-10-CM | POA: Diagnosis not present

## 2020-10-13 DIAGNOSIS — Z79899 Other long term (current) drug therapy: Secondary | ICD-10-CM | POA: Diagnosis not present

## 2020-10-13 DIAGNOSIS — H663X2 Other chronic suppurative otitis media, left ear: Secondary | ICD-10-CM | POA: Diagnosis not present

## 2020-10-13 DIAGNOSIS — Z20822 Contact with and (suspected) exposure to covid-19: Secondary | ICD-10-CM | POA: Insufficient documentation

## 2020-10-13 DIAGNOSIS — R7303 Prediabetes: Secondary | ICD-10-CM | POA: Diagnosis not present

## 2020-10-13 LAB — SARS CORONAVIRUS 2 (TAT 6-24 HRS): SARS Coronavirus 2: NEGATIVE

## 2020-10-15 ENCOUNTER — Ambulatory Visit: Payer: BC Managed Care – PPO | Admitting: Family

## 2020-10-15 ENCOUNTER — Ambulatory Visit
Admission: RE | Admit: 2020-10-15 | Discharge: 2020-10-15 | Disposition: A | Discharge status: Home / Self Care | Payer: BC Managed Care – PPO | Attending: Podiatry | Admitting: Podiatry

## 2020-10-15 ENCOUNTER — Encounter
Admission: RE | Disposition: A | Discharge status: Home / Self Care | Payer: Self-pay | Source: Home / Self Care | Attending: Podiatry

## 2020-10-15 ENCOUNTER — Encounter: Payer: Self-pay | Admitting: Podiatry

## 2020-10-15 DIAGNOSIS — Z8543 Personal history of malignant neoplasm of ovary: Secondary | ICD-10-CM | POA: Insufficient documentation

## 2020-10-15 DIAGNOSIS — D1801 Hemangioma of skin and subcutaneous tissue: Secondary | ICD-10-CM | POA: Diagnosis not present

## 2020-10-15 DIAGNOSIS — G43909 Migraine, unspecified, not intractable, without status migrainosus: Secondary | ICD-10-CM | POA: Insufficient documentation

## 2020-10-15 DIAGNOSIS — Z20822 Contact with and (suspected) exposure to covid-19: Secondary | ICD-10-CM | POA: Insufficient documentation

## 2020-10-15 DIAGNOSIS — Z79899 Other long term (current) drug therapy: Secondary | ICD-10-CM | POA: Insufficient documentation

## 2020-10-15 DIAGNOSIS — H663X2 Other chronic suppurative otitis media, left ear: Secondary | ICD-10-CM | POA: Insufficient documentation

## 2020-10-15 DIAGNOSIS — Z886 Allergy status to analgesic agent status: Secondary | ICD-10-CM | POA: Insufficient documentation

## 2020-10-15 DIAGNOSIS — R7303 Prediabetes: Secondary | ICD-10-CM | POA: Insufficient documentation

## 2020-10-15 DIAGNOSIS — G4700 Insomnia, unspecified: Secondary | ICD-10-CM | POA: Insufficient documentation

## 2020-10-15 DIAGNOSIS — Z792 Long term (current) use of antibiotics: Secondary | ICD-10-CM | POA: Insufficient documentation

## 2020-10-15 DIAGNOSIS — Z79890 Hormone replacement therapy (postmenopausal): Secondary | ICD-10-CM | POA: Insufficient documentation

## 2020-10-15 DIAGNOSIS — Z882 Allergy status to sulfonamides status: Secondary | ICD-10-CM | POA: Insufficient documentation

## 2020-10-15 DIAGNOSIS — E538 Deficiency of other specified B group vitamins: Secondary | ICD-10-CM | POA: Insufficient documentation

## 2020-10-15 HISTORY — PX: MASS EXCISION: SHX2000

## 2020-10-15 LAB — GLUCOSE, CAPILLARY: Glucose-Capillary: 115 mg/dL — ABNORMAL HIGH (ref 70–99)

## 2020-10-15 SURGERY — EXCISION MASS
Anesthesia: General | Laterality: Left

## 2020-10-15 MED ORDER — FENTANYL CITRATE (PF) 100 MCG/2ML IJ SOLN
INTRAMUSCULAR | Status: AC
  Filled 2020-10-15: qty 2

## 2020-10-15 MED ORDER — NEOMYCIN-POLYMYXIN B GU 40-200000 IR SOLN
Status: AC
  Filled 2020-10-15: qty 2

## 2020-10-15 MED ORDER — FENTANYL CITRATE (PF) 100 MCG/2ML IJ SOLN: 25.0000 ug | INTRAMUSCULAR | Status: DC | PRN

## 2020-10-15 MED ORDER — CHLORHEXIDINE GLUCONATE 0.12 % MT SOLN: 15.0000 mL | Freq: Once | OROMUCOSAL | Status: AC

## 2020-10-15 MED ORDER — CHLORHEXIDINE GLUCONATE 0.12 % MT SOLN
OROMUCOSAL | Status: AC
  Administered 2020-10-15: 15 mL via OROMUCOSAL
  Filled 2020-10-15: qty 15

## 2020-10-15 MED ORDER — POVIDONE-IODINE 7.5 % EX SOLN
Freq: Once | CUTANEOUS | Status: DC
  Filled 2020-10-15: qty 118

## 2020-10-15 MED ORDER — OXYCODONE HCL 5 MG PO TABS: 5.0000 mg | ORAL_TABLET | Freq: Once | ORAL | Status: DC | PRN

## 2020-10-15 MED ORDER — MIDAZOLAM HCL 2 MG/2ML IJ SOLN
INTRAMUSCULAR | Status: DC | PRN
  Administered 2020-10-15: 2 mg via INTRAVENOUS

## 2020-10-15 MED ORDER — OXYCODONE HCL 5 MG/5ML PO SOLN: 5.0000 mg | Freq: Once | ORAL | Status: DC | PRN

## 2020-10-15 MED ORDER — FAMOTIDINE 20 MG PO TABS: 20.0000 mg | ORAL_TABLET | Freq: Once | ORAL | Status: AC

## 2020-10-15 MED ORDER — PROPOFOL 10 MG/ML IV BOLUS
INTRAVENOUS | Status: AC
  Filled 2020-10-15: qty 40

## 2020-10-15 MED ORDER — CEFAZOLIN SODIUM-DEXTROSE 2-4 GM/100ML-% IV SOLN
2.0000 g | INTRAVENOUS | Status: AC
  Administered 2020-10-15: 2 g via INTRAVENOUS

## 2020-10-15 MED ORDER — ORAL CARE MOUTH RINSE: 15.0000 mL | Freq: Once | OROMUCOSAL | Status: AC

## 2020-10-15 MED ORDER — CEFAZOLIN SODIUM-DEXTROSE 2-4 GM/100ML-% IV SOLN
INTRAVENOUS | Status: AC
  Filled 2020-10-15: qty 100

## 2020-10-15 MED ORDER — MEPERIDINE HCL 50 MG/ML IJ SOLN: 6.2500 mg | INTRAMUSCULAR | Status: DC | PRN

## 2020-10-15 MED ORDER — HYDROCODONE-ACETAMINOPHEN 5-325 MG PO TABS: 1.0000 | ORAL_TABLET | ORAL | 0 refills | Status: DC | PRN

## 2020-10-15 MED ORDER — ONDANSETRON HCL 4 MG/2ML IJ SOLN
INTRAMUSCULAR | Status: DC | PRN
  Administered 2020-10-15: 4 mg via INTRAVENOUS

## 2020-10-15 MED ORDER — DEXAMETHASONE SODIUM PHOSPHATE 10 MG/ML IJ SOLN
INTRAMUSCULAR | Status: DC | PRN
  Administered 2020-10-15: 10 mg via INTRAVENOUS

## 2020-10-15 MED ORDER — PROPOFOL 10 MG/ML IV BOLUS
INTRAVENOUS | Status: DC | PRN
  Administered 2020-10-15: 160 mg via INTRAVENOUS

## 2020-10-15 MED ORDER — PROMETHAZINE HCL 25 MG/ML IJ SOLN: 6.2500 mg | INTRAMUSCULAR | Status: DC | PRN

## 2020-10-15 MED ORDER — SODIUM CHLORIDE 0.9 % IV SOLN: INTRAVENOUS | Status: DC

## 2020-10-15 MED ORDER — FENTANYL CITRATE (PF) 100 MCG/2ML IJ SOLN
INTRAMUSCULAR | Status: DC | PRN
  Administered 2020-10-15: 25 ug via INTRAVENOUS

## 2020-10-15 MED ORDER — MIDAZOLAM HCL 2 MG/2ML IJ SOLN
INTRAMUSCULAR | Status: AC
  Filled 2020-10-15: qty 2

## 2020-10-15 MED ORDER — FAMOTIDINE 20 MG PO TABS
ORAL_TABLET | ORAL | Status: AC
  Administered 2020-10-15: 20 mg via ORAL
  Filled 2020-10-15: qty 1

## 2020-10-15 MED ORDER — BUPIVACAINE HCL (PF) 0.5 % IJ SOLN
INTRAMUSCULAR | Status: DC | PRN
  Administered 2020-10-15: 9 mL

## 2020-10-15 SURGICAL SUPPLY — 50 items
BLADE OSCILLATING/SAGITTAL (BLADE)
BLADE SURG 15 STRL LF DISP TIS (BLADE) ×1 IMPLANT
BLADE SURG 15 STRL SS (BLADE) ×2
BLADE SURG MINI STRL (BLADE) IMPLANT
BLADE SW THK.38XMED LNG THN (BLADE) IMPLANT
BNDG CMPR STD VLCR NS LF 5.8X4 (GAUZE/BANDAGES/DRESSINGS) ×1
BNDG CONFORM 2 STRL LF (GAUZE/BANDAGES/DRESSINGS) ×2 IMPLANT
BNDG ELASTIC 4X5.8 VLCR NS LF (GAUZE/BANDAGES/DRESSINGS) ×2 IMPLANT
BNDG ESMARK 4X12 TAN STRL LF (GAUZE/BANDAGES/DRESSINGS) ×2 IMPLANT
BNDG GAUZE 4.5X4.1 6PLY STRL (MISCELLANEOUS) ×2 IMPLANT
COVER WAND RF STERILE (DRAPES) ×2 IMPLANT
CUFF TOURN SGL QUICK 12 (TOURNIQUET CUFF) ×2 IMPLANT
CUFF TOURN SGL QUICK 18X4 (TOURNIQUET CUFF) IMPLANT
DRAPE FLUOR MINI C-ARM 54X84 (DRAPES) IMPLANT
DURAPREP 26ML APPLICATOR (WOUND CARE) ×2 IMPLANT
ELECT CAUTERY BLADE 6.4 (BLADE) ×2 IMPLANT
ELECT REM PT RETURN 9FT ADLT (ELECTROSURGICAL) ×2
ELECTRODE REM PT RTRN 9FT ADLT (ELECTROSURGICAL) ×1 IMPLANT
GAUZE SPONGE 4X4 12PLY STRL (GAUZE/BANDAGES/DRESSINGS) ×2 IMPLANT
GAUZE XEROFORM 1X8 LF (GAUZE/BANDAGES/DRESSINGS) ×2 IMPLANT
GLOVE SURG ENC MOIS LTX SZ7.5 (GLOVE) ×2 IMPLANT
GLOVE SURG UNDER LTX SZ8 (GLOVE) ×2 IMPLANT
GOWN STRL REUS W/ TWL LRG LVL3 (GOWN DISPOSABLE) ×2 IMPLANT
GOWN STRL REUS W/TWL LRG LVL3 (GOWN DISPOSABLE) ×4
HANDPIECE VERSAJET DEBRIDEMENT (MISCELLANEOUS) ×2 IMPLANT
KIT STIMULAN RAPID CURE 5CC (Orthopedic Implant) IMPLANT
KIT TURNOVER KIT A (KITS) ×2 IMPLANT
LABEL OR SOLS (LABEL) ×2 IMPLANT
MANIFOLD NEPTUNE II (INSTRUMENTS) ×2 IMPLANT
NEEDLE FILTER BLUNT 18X 1/2SAF (NEEDLE) ×1
NEEDLE FILTER BLUNT 18X1 1/2 (NEEDLE) ×1 IMPLANT
NEEDLE HYPO 25X1 1.5 SAFETY (NEEDLE) ×6 IMPLANT
NS IRRIG 500ML POUR BTL (IV SOLUTION) ×2 IMPLANT
PACK EXTREMITY ARMC (MISCELLANEOUS) ×2 IMPLANT
PAD ABD DERMACEA PRESS 5X9 (GAUZE/BANDAGES/DRESSINGS) ×4 IMPLANT
PENCIL ELECTRO HAND CTR (MISCELLANEOUS) ×2 IMPLANT
RASP SM TEAR CROSS CUT (RASP) IMPLANT
SOL PREP PVP 2OZ (MISCELLANEOUS) ×2
SOLUTION PREP PVP 2OZ (MISCELLANEOUS) ×1 IMPLANT
STOCKINETTE STRL 6IN 960660 (GAUZE/BANDAGES/DRESSINGS) ×2 IMPLANT
SUT ETHILON 3-0 FS-10 30 BLK (SUTURE) ×2
SUT ETHILON 4-0 (SUTURE) ×2
SUT ETHILON 4-0 FS2 18XMFL BLK (SUTURE) ×1
SUT VIC AB 3-0 SH 27 (SUTURE)
SUT VIC AB 3-0 SH 27X BRD (SUTURE) IMPLANT
SUT VIC AB 4-0 FS2 27 (SUTURE) IMPLANT
SUTURE EHLN 3-0 FS-10 30 BLK (SUTURE) ×1 IMPLANT
SUTURE ETHLN 4-0 FS2 18XMF BLK (SUTURE) ×1 IMPLANT
SYR 10ML LL (SYRINGE) ×4 IMPLANT
SYR 3ML LL SCALE MARK (SYRINGE) ×2 IMPLANT

## 2020-10-15 NOTE — Anesthesia Preprocedure Evaluation (Signed)
Anesthesia Evaluation  Patient identified by MRN, date of birth, ID band Patient awake    Reviewed: Allergy & Precautions, NPO status , Patient's Chart, lab work & pertinent test results  History of Anesthesia Complications Negative for: history of anesthetic complications  Airway Mallampati: II  TM Distance: <3 FB Neck ROM: Full    Dental no notable dental hx.    Pulmonary asthma (uses inhalers with URI) , neg sleep apnea,    breath sounds clear to auscultation- rhonchi (-) wheezing      Cardiovascular Exercise Tolerance: Good (-) hypertension(-) CAD, (-) Past MI, (-) Cardiac Stents and (-) CABG  Rhythm:Regular Rate:Normal - Systolic murmurs and - Diastolic murmurs    Neuro/Psych  Headaches, neg Seizures PSYCHIATRIC DISORDERS Anxiety    GI/Hepatic negative GI ROS, Neg liver ROS,   Endo/Other  Diabetes: prediabetic, diet controlled.  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  No date: Headache No date: History of kidney stones No date: Overweight (BMI 25.0-29.9) No date: Plantar fasciitis No date: PONV (postoperative nausea and vomiting)   Reproductive/Obstetrics                             Anesthesia Physical Anesthesia Plan  ASA: II  Anesthesia Plan: General   Post-op Pain Management:    Induction: Intravenous  PONV Risk Score and Plan: 2 and Ondansetron, Dexamethasone and Midazolam  Airway  Management Planned: LMA  Additional Equipment:   Intra-op Plan:   Post-operative Plan:   Informed Consent: I have reviewed the patients History and Physical, chart, labs and discussed the procedure including the risks, benefits and alternatives for the proposed anesthesia with the patient or authorized representative who has indicated his/her understanding and acceptance.     Dental advisory given  Plan Discussed with: CRNA and Anesthesiologist  Anesthesia Plan Comments:         Anesthesia Quick Evaluation

## 2020-10-15 NOTE — Discharge Instructions (Addendum)
1.  Elevate the left lower extremity.  2.  Keep the bandage on the left foot clean, dry, and do not remove.  3.  Sponge bathe only left lower extremity.  4.  Wear surgical shoe on the left foot whenever standing or walking.  5.  Take 1 pain pill, Norco, every 4 hours only if needed for pain.   AMBULATORY SURGERY  DISCHARGE INSTRUCTIONS   1) The drugs that you were given will stay in your system until tomorrow so for the next 24 hours you should not:  A) Drive an automobile B) Make any legal decisions C) Drink any alcoholic beverage   2) You may resume regular meals tomorrow.  Today it is better to start with liquids and gradually work up to solid foods.  You may eat anything you prefer, but it is better to start with liquids, then soup and crackers, and gradually work up to solid foods.   3) Please notify your doctor immediately if you have any unusual bleeding, trouble breathing, redness and pain at the surgery site, drainage, fever, or pain not relieved by medication.    4) Additional Instructions:        Please contact your physician with any problems or Same Day Surgery at [phone], Monday through Friday 6 am to 4 pm, or [place] [facility] number at [phone].

## 2020-10-15 NOTE — H&P (Signed)
Subjective: Patient presents today for surgery for removal of a growth on her left second toe.  Objective: Neurovascular status intact.  There is still a small palpable soft tissue growth on the plantar medial aspect of the left second toe.  No erythema or edema.  Assessment: Soft tissue tumor left second toe.  Plan: Medical history and physical in the chart was reviewed.  No interval change in her foot assessment.  Patient stable to proceed with surgery for excision tumor left second toe.

## 2020-10-15 NOTE — Interval H&P Note (Signed)
History and Physical Interval Note:  10/15/2020 7:14 AM  Dawn [name]  has presented today for surgery, with the diagnosis of D49.2- SOFT TISSUE TUMOR.  The various methods of treatment have been discussed with the patient and family. After consideration of risks, benefits and other options for treatment, the patient has consented to  Procedure(s): EXCISION TUMOR FOOT - SUBQ. LEFT (Left) as a surgical intervention.  The patient's history has been reviewed, patient examined, no change in status, stable for surgery.  I have reviewed the patient's chart and labs.  Questions were answered to the patient's satisfaction.     Durward Fortes

## 2020-10-15 NOTE — Transfer of Care (Signed)
Immediate Anesthesia Transfer of Care Note  Patient: Dawn [name]  Procedure(s) Performed: EXCISION TUMOR FOOT - SUBQ. LEFT (Left )  Patient Location: PACU  Anesthesia Type:General  Level of Consciousness: drowsy and patient cooperative  Airway & Oxygen Therapy: Patient Spontanous Breathing  Post-op Assessment: Report given to RN and Post -op Vital signs reviewed and stable  Post vital signs: Reviewed and stable  Last Vitals:  Vitals Value Taken Time  BP 132/82 10/15/20 0817  Temp 36.2 C 10/15/20 0817  Pulse 73 10/15/20 0820  Resp 13 10/15/20 0820  SpO2 100 % 10/15/20 0820  Vitals shown include unvalidated device data.  Last Pain:  Vitals:   10/15/20 0817  TempSrc:   PainSc: Asleep         Complications: No complications documented.

## 2020-10-15 NOTE — Anesthesia Procedure Notes (Signed)
Procedure Name: LMA Insertion Date/Time: 10/15/2020 7:38 AM Performed by: Gentry Fitz, CRNA Pre-anesthesia Checklist: Patient identified, Emergency Drugs available, Suction available and Patient being monitored Patient Re-evaluated:Patient Re-evaluated prior to induction Oxygen Delivery Method: Circle system utilized Preoxygenation: Pre-oxygenation with 100% oxygen Induction Type: IV induction Number of attempts: 1 Tube secured with: Tape Dental Injury: Teeth and Oropharynx as per pre-operative assessment

## 2020-10-15 NOTE — Anesthesia Postprocedure Evaluation (Signed)
Anesthesia Post Note  Patient: Dawn [name]  Procedure(s) Performed: EXCISION TUMOR FOOT - SUBQ. LEFT (Left )  Patient location during evaluation: PACU Anesthesia Type: General Level of consciousness: awake and alert and oriented Pain management: pain level controlled Vital Signs Assessment: post-procedure vital signs reviewed and stable Respiratory status: spontaneous breathing, nonlabored ventilation and respiratory function stable Cardiovascular status: blood pressure returned to baseline and stable Postop Assessment: no signs of nausea or vomiting Anesthetic complications: no   No complications documented.   Last Vitals:  Vitals:   10/15/20 0845 10/15/20 0858  BP: 137/89 (!) 145/79  Pulse: 70 61  Resp: 12 16  Temp: 36.6 C (!) 36.3 C  SpO2: 99% 100%    Last Pain:  Vitals:   10/15/20 0858  TempSrc: Temporal  PainSc: 0-No pain                 Clemencia Helzer

## 2020-10-15 NOTE — Op Note (Signed)
Date of operation: 10/15/2020.  Surgeon: Durward Fortes D.P.M.  Preoperative diagnosis: Soft tissue tumor left second toe.  Postoperative diagnosis: Same pending pathology.  Procedure: Excision soft tissue tumor left second toe.  Anesthesia: LMA with local.  Hemostasis: Pneumatic tourniquet left ankle 250 mmHg.  Estimated blood loss: Less than 5 cc.  Pathology: Soft tissue tumor left second toe.  Complications: None apparent.  Operative indications: This is a 45 year old female with development of a painful knot on the bottom of her left second toe.  Conservative care with padding and accommodation did not improve her condition and she did have an MRI and decision was made for removal of the area beneath her second toe.  Operative procedure: Patient was taken to the operating room and placed on the table in the supine position.  Following satisfactory LMA anesthesia the left forefoot was anesthetized with 9 cc of 0.5% Marcaine plain.  Pneumatic tourniquet applied to the level of the left ankle and the foot was prepped and draped in the usual sterile fashion.  Foot was exsanguinated and the tourniquet inflated to 250 mmHg.    Attention was then directed to the plantar aspect of the left second toe where an approximate 1.5 cm linear incision was made coursing proximal to distal overlying the lesion.  The incision was carefully sharply dissected with tenotomy scissors through the tissues revealing a small mass.  The mass was carefully separated from the surrounding tissues and removed in toto.  Incision was flushed with copious amounts of sterile saline and closed using 4-0 nylon simple interrupted sutures.  Xeroform 4 x 4's and conformer applied to the left foot and second toe and the tourniquet was released and blood flow noted return immediately to all digits.  Kerlix and an Ace wrap then applied.  Patient was awakened and transported to the PACU with vital signs stable and in good  condition.

## 2020-10-19 LAB — SURGICAL PATHOLOGY

## 2021-03-31 ENCOUNTER — Other Ambulatory Visit: Payer: Self-pay | Admitting: Family Medicine

## 2021-03-31 ENCOUNTER — Encounter: Payer: Self-pay | Admitting: Family Medicine

## 2021-03-31 ENCOUNTER — Ambulatory Visit: Payer: BC Managed Care – PPO | Admitting: Family Medicine

## 2021-03-31 ENCOUNTER — Other Ambulatory Visit: Payer: Self-pay

## 2021-03-31 VITALS — BP 127/89 | HR 86 | Ht 66.0 in | Wt 156.0 lb

## 2021-03-31 DIAGNOSIS — Z3041 Encounter for surveillance of contraceptive pills: Secondary | ICD-10-CM | POA: Diagnosis not present

## 2021-03-31 DIAGNOSIS — Z1231 Encounter for screening mammogram for malignant neoplasm of breast: Secondary | ICD-10-CM | POA: Diagnosis not present

## 2021-03-31 DIAGNOSIS — E8940 Asymptomatic postprocedural ovarian failure: Secondary | ICD-10-CM | POA: Diagnosis not present

## 2021-03-31 MED ORDER — ESTRADIOL-NORETHINDRONE ACET 0.5-0.1 MG PO TABS: 1.0000 | ORAL_TABLET | Freq: Every day | ORAL | 3 refills | Status: DC

## 2021-03-31 NOTE — Assessment & Plan Note (Signed)
Continue low dose OC's. Consider transition to higher dose transdermal application of estrogen only at age 45.

## 2021-03-31 NOTE — Progress Notes (Signed)
   Subjective:    Patient ID: Dawn [name] is a 45 y.o. female presenting with No chief complaint on file.  on 03/31/2021  HPI: Here today to establish care. Previously a patient with Dr. Leonides Schanz who has left town. Has h/o hyst + BSO and on low dose OCs for control of climacteric symptoms. Initially tried on transdermal estrogen, but did not manage symptoms enough. Needs refill on meds. Has h/o granulosa cell tumor.  Review of Systems  Constitutional:  Negative for chills and fever.  Respiratory:  Negative for shortness of breath.   Cardiovascular:  Negative for chest pain.  Gastrointestinal:  Negative for abdominal pain, nausea and vomiting.  Genitourinary:  Negative for dysuria.  Skin:  Negative for rash.     Objective:    BP 127/89   Pulse 86   Ht 5\' 6"  (1.676 m)   Wt 156 lb (70.8 kg)   LMP 11/22/2012   BMI 25.18 kg/m  Physical Exam Constitutional:      General: She is not in acute distress.    Appearance: She is well-developed.  HENT:     Head: Normocephalic and atraumatic.  Eyes:     General: No scleral icterus. Cardiovascular:     Rate and Rhythm: Normal rate.  Pulmonary:     Effort: Pulmonary effort is normal.  Abdominal:     Palpations: Abdomen is soft.  Musculoskeletal:     Cervical back: Neck supple.  Skin:    General: Skin is warm and dry.  Neurological:     Mental Status: She is alert and oriented to person, place, and time.        Assessment & Plan:   Problem List Items Addressed This Visit       Unprioritized   Surgical menopause    Continue low dose OC's. Consider transition to higher dose transdermal application of estrogen only at age 89.      Other Visit Diagnoses     Encounter for screening mammogram for breast cancer    -  Primary   Surveillance of contraceptive pill       Relevant Medications   Estradiol-Norethindrone Acet 0.5-0.1 MG tablet        Return in about 6 months (around 09/29/2021).  Donnamae [name] 03/31/2021 2:36  PM

## 2021-04-18 ENCOUNTER — Encounter: Payer: BC Managed Care – PPO | Admitting: Family Medicine

## 2021-04-20 ENCOUNTER — Ambulatory Visit
Admission: RE | Admit: 2021-04-20 | Discharge: 2021-04-20 | Disposition: A | Discharge status: Home / Self Care | Payer: BC Managed Care – PPO | Source: Ambulatory Visit | Attending: Family Medicine | Admitting: Family Medicine

## 2021-04-20 ENCOUNTER — Other Ambulatory Visit: Payer: Self-pay

## 2021-04-20 DIAGNOSIS — Z1231 Encounter for screening mammogram for malignant neoplasm of breast: Secondary | ICD-10-CM

## 2021-10-18 ENCOUNTER — Other Ambulatory Visit: Payer: Self-pay | Admitting: Gastroenterology

## 2021-10-18 DIAGNOSIS — R1084 Generalized abdominal pain: Secondary | ICD-10-CM

## 2021-10-20 ENCOUNTER — Ambulatory Visit
Admission: RE | Admit: 2021-10-20 | Discharge: 2021-10-20 | Disposition: A | Discharge status: Home / Self Care | Payer: BC Managed Care – PPO | Source: Ambulatory Visit | Attending: Gastroenterology | Admitting: Gastroenterology

## 2021-10-20 DIAGNOSIS — R1084 Generalized abdominal pain: Secondary | ICD-10-CM

## 2021-12-01 ENCOUNTER — Ambulatory Visit: Payer: Self-pay | Admitting: Internal Medicine

## 2022-01-02 ENCOUNTER — Encounter (INDEPENDENT_AMBULATORY_CARE_PROVIDER_SITE_OTHER): Payer: BC Managed Care – PPO | Admitting: Family Medicine

## 2022-01-02 DIAGNOSIS — E8940 Asymptomatic postprocedural ovarian failure: Secondary | ICD-10-CM | POA: Diagnosis not present

## 2022-01-11 MED ORDER — NORETHIN ACE-ETH ESTRAD-FE 1-20 MG-MCG(24) PO TABS: 1.0000 | ORAL_TABLET | Freq: Every day | ORAL | 3 refills | Status: DC

## 2022-01-11 NOTE — Telephone Encounter (Signed)
Please see the MyChart message reply(ies) for my assessment and plan.    This patient gave consent for this Medical Advice Message and is aware that it may result in a bill to [employer], as well as the possibility of receiving a bill for a co-payment or deductible. They are an established patient, but are not seeking medical advice exclusively about a problem treated during an in person or video visit in the last seven days. I did not recommend an in person or video visit within seven days of my reply.    I spent a total of 11 minutes cumulative time within 7 days through [employer].  [name], MD

## 2022-01-27 SURGERY — Surgical Case
Anesthesia: *Unknown

## 2022-02-13 ENCOUNTER — Ambulatory Visit: Payer: Self-pay | Admitting: General Surgery

## 2022-02-13 DIAGNOSIS — K8020 Calculus of gallbladder without cholecystitis without obstruction: Secondary | ICD-10-CM

## 2022-02-13 NOTE — Patient Instructions (Addendum)
SURGICAL WAITING ROOM VISITATION Patients having surgery or a procedure may have no more than 2 support people in the waiting area - these visitors may rotate.   Children under the age of 36 must have an adult with them who is not the patient. If the patient needs to stay at the hospital during part of their recovery, the visitor guidelines for inpatient rooms apply. Pre-op nurse will coordinate an appropriate time for 1 support person to accompany patient in pre-op.  This support person may not rotate.    Please refer to the [facility] website for the visitor guidelines for Inpatients (after your surgery is over and you are in a regular room).      Your procedure is scheduled on: 03-02-22   Report to [facility] Main Entrance    Report to admitting at 7:15 AM   Call this number if you have problems the morning of surgery [phone]   Do not eat food :After Midnight.   After Midnight you may have the following liquids until  6:30 AM DAY OF SURGERY  Water Non-Citrus Juices (without pulp, NO RED) Carbonated Beverages Black Coffee (NO MILK/CREAM OR CREAMERS, sugar ok)  Clear Tea (NO MILK/CREAM OR CREAMERS, sugar ok) regular and decaf                             Plain Jell-O (NO RED)                                           Fruit ices (not with fruit pulp, NO RED)                                     Popsicles (NO RED)                                                               Sports drinks like Gatorade (NO RED)                   The day of surgery:  Drink ONE (1) Pre-Surgery G2 at 6:30 AM the morning of surgery. Drink in one sitting. Do not sip.  This drink was given to you during your hospital  pre-op appointment visit. Nothing else to drink after completing the Pre-Surgery Clear Ensure or G2.          If you have questions, please contact your surgeon's office.   FOLLOW  AND ANY ADDITIONAL PRE OP INSTRUCTIONS YOU RECEIVED FROM YOUR SURGEON'S OFFICE!!!      Oral Hygiene is also important to reduce your risk of infection.                                    Remember - BRUSH YOUR TEETH THE MORNING OF SURGERY WITH YOUR REGULAR TOOTHPASTE   Do NOT smoke after Midnight   Take these medicines the morning of surgery with A SIP OF WATER: Zyrtec or Claritin, Topiramate, okay to use eardrops  You may not have any metal on your body including hair pins, jewelry, and body piercing             Do not wear make-up, lotions, powders, perfumes or deodorant  Do not wear nail polish including gel and S&S, artificial/acrylic nails, or any other type of covering on natural nails including finger and toenails. If you have artificial nails, gel coating, etc. that needs to be removed by a nail salon please have this removed prior to surgery or surgery may need to be canceled/ delayed if the surgeon/ anesthesia feels like they are unable to be safely monitored.   Do not shave  48 hours prior to surgery.    Do not bring valuables to the hospital. [place].   Contacts, dentures or bridgework may not be worn into surgery.  DO NOT [place]. PHARMACY WILL DISPENSE MEDICATIONS LISTED ON YOUR MEDICATION LIST TO YOU DURING YOUR ADMISSION [place]!   Patients discharged on the day of surgery will not be allowed to drive home.  Someone NEEDS to stay with you for the first 24 hours after anesthesia.   Please read over the following fact sheets you were given: IF YOU HAVE QUESTIONS ABOUT YOUR PRE-OP INSTRUCTIONS PLEASE CALL [place] - Preparing for Surgery Before surgery, you can play an important role.  Because skin is not sterile, your skin needs to be as free of germs as possible.  You can reduce the number of germs on your skin by washing with CHG (chlorahexidine gluconate) soap before surgery.  CHG is an antiseptic cleaner which kills germs and  bonds with the skin to continue killing germs even after washing. Please DO NOT use if you have an allergy to CHG or antibacterial soaps.  If your skin becomes reddened/irritated stop using the CHG and inform your nurse when you arrive at Short Stay. Do not shave (including legs and underarms) for at least 48 hours prior to the first CHG shower.  You may shave your face/neck.  Please follow these instructions carefully:  1.  Shower with CHG Soap the night before surgery and the  morning of surgery.  2.  If you choose to wash your hair, wash your hair first as usual with your normal  shampoo.  3.  After you shampoo, rinse your hair and body thoroughly to remove the shampoo.                             4.  Use CHG as you would any other liquid soap.  You can apply chg directly to the skin and wash.  Gently with a scrungie or clean washcloth.  5.  Apply the CHG Soap to your body ONLY FROM THE NECK DOWN.   Do   not use on face/ open                           Wound or open sores. Avoid contact with eyes, ears mouth and   genitals (private parts).                       Wash face,  Genitals (private parts) with your normal soap.             6.  Wash thoroughly, paying special attention to the  area where your    surgery  will be performed.  7.  Thoroughly rinse your body with warm water from the neck down.  8.  DO NOT shower/wash with your normal soap after using and rinsing off the CHG Soap.                9.  Pat yourself dry with a clean towel.            10.  Wear clean pajamas.            11.  Place clean sheets on your bed the night of your first shower and do not  sleep with pets. Day of Surgery : Do not apply any lotions/deodorants the morning of surgery.  Please wear clean clothes to the hospital/surgery center.  FAILURE TO FOLLOW THESE INSTRUCTIONS MAY RESULT IN THE CANCELLATION OF YOUR SURGERY  PATIENT SIGNATURE_________________________________  NURSE  SIGNATURE__________________________________  ________________________________________________________________________

## 2022-02-13 NOTE — Progress Notes (Signed)
Surgery orders requested via Epic inbox. °

## 2022-02-17 ENCOUNTER — Encounter (HOSPITAL_COMMUNITY): Payer: Self-pay

## 2022-02-17 ENCOUNTER — Other Ambulatory Visit: Payer: Self-pay

## 2022-02-17 ENCOUNTER — Encounter (HOSPITAL_COMMUNITY)
Admission: RE | Admit: 2022-02-17 | Discharge: 2022-02-17 | Disposition: A | Discharge status: Home / Self Care | Payer: BC Managed Care – PPO | Source: Ambulatory Visit | Attending: General Surgery | Admitting: General Surgery

## 2022-02-17 VITALS — BP 117/90 | HR 75 | Temp 98.4°F | Resp 14 | Ht 65.0 in | Wt 158.8 lb

## 2022-02-17 DIAGNOSIS — Z01812 Encounter for preprocedural laboratory examination: Secondary | ICD-10-CM | POA: Diagnosis present

## 2022-02-17 DIAGNOSIS — K8020 Calculus of gallbladder without cholecystitis without obstruction: Secondary | ICD-10-CM | POA: Insufficient documentation

## 2022-02-17 DIAGNOSIS — R7303 Prediabetes: Secondary | ICD-10-CM | POA: Insufficient documentation

## 2022-02-17 HISTORY — DX: Gastro-esophageal reflux disease without esophagitis: K21.9

## 2022-02-17 HISTORY — DX: Prediabetes: R73.03

## 2022-02-17 LAB — CBC WITH DIFFERENTIAL/PLATELET
Abs Immature Granulocytes: 0.01 10*3/uL (ref 0.00–0.07)
Basophils Absolute: 0.1 10*3/uL (ref 0.0–0.1)
Basophils Relative: 1 %
Eosinophils Absolute: 0.1 10*3/uL (ref 0.0–0.5)
Eosinophils Relative: 2 %
HCT: 43 % (ref 36.0–46.0)
Hemoglobin: 13.7 g/dL (ref 12.0–15.0)
Immature Granulocytes: 0 %
Lymphocytes Relative: 28 %
Lymphs Abs: 1.5 10*3/uL (ref 0.7–4.0)
MCH: 30 pg (ref 26.0–34.0)
MCHC: 31.9 g/dL (ref 30.0–36.0)
MCV: 94.1 fL (ref 80.0–100.0)
Monocytes Absolute: 0.4 10*3/uL (ref 0.1–1.0)
Monocytes Relative: 7 %
Neutro Abs: 3.2 10*3/uL (ref 1.7–7.7)
Neutrophils Relative %: 62 %
Platelets: 362 10*3/uL (ref 150–400)
RBC: 4.57 MIL/uL (ref 3.87–5.11)
RDW: 12.9 % (ref 11.5–15.5)
WBC: 5.2 10*3/uL (ref 4.0–10.5)
nRBC: 0 % (ref 0.0–0.2)

## 2022-02-17 LAB — COMPREHENSIVE METABOLIC PANEL
ALT: 15 U/L (ref 0–44)
AST: 14 U/L — ABNORMAL LOW (ref 15–41)
Albumin: 4.1 g/dL (ref 3.5–5.0)
Alkaline Phosphatase: 52 U/L (ref 38–126)
Anion gap: 8 (ref 5–15)
BUN: 21 mg/dL — ABNORMAL HIGH (ref 6–20)
CO2: 24 mmol/L (ref 22–32)
Calcium: 9.5 mg/dL (ref 8.9–10.3)
Chloride: 107 mmol/L (ref 98–111)
Creatinine, Ser: 0.97 mg/dL (ref 0.44–1.00)
GFR, Estimated: >60 mL/min (ref >60)
Glucose, Bld: 118 mg/dL — ABNORMAL HIGH (ref 70–99)
Potassium: 4.3 mmol/L (ref 3.5–5.1)
Sodium: 139 mmol/L (ref 135–145)
Total Bilirubin: 0.5 mg/dL (ref 0.3–1.2)
Total Protein: 7.6 g/dL (ref 6.5–8.1)

## 2022-02-17 LAB — GLUCOSE, CAPILLARY: Glucose-Capillary: 99 mg/dL (ref 70–99)

## 2022-02-17 LAB — HEMOGLOBIN A1C
Hgb A1c MFr Bld: 5.8 % — ABNORMAL HIGH (ref 4.8–5.6)
Mean Plasma Glucose: 119.76 mg/dL

## 2022-02-17 NOTE — Progress Notes (Signed)
COVID Vaccine Completed:  No  Date of COVID positive in last 90 days:  No  PCP - Reginia Forts, MD Cardiologist - N/A  Chest x-ray - N/A EKG - N/A Stress Test - N/A ECHO - N/A Cardiac Cath - N/A Pacemaker/ICD device last checked: Spinal Cord Stimulator:N/A  Bowel Prep - N/A  Sleep Study -   Yes, neg sleep apnea CPAP - No  Prediabetes Fasting Blood Sugar -  Checks Blood Sugar - does not check   Blood Thinner Instructions:N/A Aspirin Instructions: Last Dose:  Activity level:   Can go up a flight of stairs and perform activities of daily living without stopping and without symptoms of chest pain or shortness of breath.  Able to exercise without symptoms  Anesthesia review: N/A  Patient denies shortness of breath, fever, cough and chest pain at PAT appointment  Patient verbalized understanding of instructions that were given to them at the PAT appointment. Patient was also instructed that they will need to review over the PAT instructions again at home before surgery.

## 2022-03-02 ENCOUNTER — Encounter (HOSPITAL_COMMUNITY): Payer: Self-pay | Admitting: General Surgery

## 2022-03-02 ENCOUNTER — Ambulatory Visit (HOSPITAL_COMMUNITY): Payer: BC Managed Care – PPO | Admitting: Anesthesiology

## 2022-03-02 ENCOUNTER — Encounter (HOSPITAL_COMMUNITY)
Admission: RE | Disposition: A | Discharge status: Home / Self Care | Payer: Self-pay | Source: Ambulatory Visit | Attending: General Surgery

## 2022-03-02 ENCOUNTER — Other Ambulatory Visit: Payer: Self-pay

## 2022-03-02 ENCOUNTER — Ambulatory Visit (HOSPITAL_COMMUNITY)
Admission: RE | Admit: 2022-03-02 | Discharge: 2022-03-02 | Disposition: A | Discharge status: Home / Self Care | Payer: BC Managed Care – PPO | Source: Ambulatory Visit | Attending: General Surgery | Admitting: General Surgery

## 2022-03-02 DIAGNOSIS — K8010 Calculus of gallbladder with chronic cholecystitis without obstruction: Secondary | ICD-10-CM | POA: Diagnosis not present

## 2022-03-02 DIAGNOSIS — K8020 Calculus of gallbladder without cholecystitis without obstruction: Secondary | ICD-10-CM | POA: Diagnosis present

## 2022-03-02 DIAGNOSIS — K5909 Other constipation: Secondary | ICD-10-CM | POA: Diagnosis not present

## 2022-03-02 HISTORY — PX: CHOLECYSTECTOMY: SHX55

## 2022-03-02 LAB — GLUCOSE, CAPILLARY: Glucose-Capillary: 102 mg/dL — ABNORMAL HIGH (ref 70–99)

## 2022-03-02 SURGERY — LAPAROSCOPIC CHOLECYSTECTOMY WITH INTRAOPERATIVE CHOLANGIOGRAM
Anesthesia: General

## 2022-03-02 MED ORDER — BUPIVACAINE-EPINEPHRINE 0.25% -1:200000 IJ SOLN
INTRAMUSCULAR | Status: DC | PRN
  Administered 2022-03-02: 30 mL

## 2022-03-02 MED ORDER — OXYCODONE HCL 5 MG PO TABS
ORAL_TABLET | ORAL | Status: AC
  Filled 2022-03-02: qty 1

## 2022-03-02 MED ORDER — MIDAZOLAM HCL 2 MG/2ML IJ SOLN
INTRAMUSCULAR | Status: AC
  Filled 2022-03-02: qty 2

## 2022-03-02 MED ORDER — ENSURE PRE-SURGERY PO LIQD
296.0000 mL | Freq: Once | ORAL | Status: DC
  Filled 2022-03-02: qty 296

## 2022-03-02 MED ORDER — OXYCODONE HCL 5 MG PO TABS
5.0000 mg | ORAL_TABLET | Freq: Once | ORAL | Status: AC | PRN
  Administered 2022-03-02: 5 mg via ORAL

## 2022-03-02 MED ORDER — MIDAZOLAM HCL 5 MG/5ML IJ SOLN
INTRAMUSCULAR | Status: DC | PRN
  Administered 2022-03-02 (×2): 1 mg via INTRAVENOUS

## 2022-03-02 MED ORDER — SCOPOLAMINE 1 MG/3DAYS TD PT72
1.0000 | MEDICATED_PATCH | Freq: Once | TRANSDERMAL | Status: DC
  Administered 2022-03-02: 1.5 mg via TRANSDERMAL
  Filled 2022-03-02: qty 1

## 2022-03-02 MED ORDER — DEXAMETHASONE SODIUM PHOSPHATE 10 MG/ML IJ SOLN
INTRAMUSCULAR | Status: AC
  Filled 2022-03-02: qty 1

## 2022-03-02 MED ORDER — SUGAMMADEX SODIUM 200 MG/2ML IV SOLN
INTRAVENOUS | Status: DC | PRN
  Administered 2022-03-02: 200 mg via INTRAVENOUS

## 2022-03-02 MED ORDER — DEXMEDETOMIDINE HCL IN NACL 80 MCG/20ML IV SOLN
INTRAVENOUS | Status: DC | PRN
  Administered 2022-03-02: 8 ug via BUCCAL

## 2022-03-02 MED ORDER — ACETAMINOPHEN 500 MG PO TABS
1000.0000 mg | ORAL_TABLET | Freq: Once | ORAL | Status: AC
  Administered 2022-03-02: 1000 mg via ORAL

## 2022-03-02 MED ORDER — PROPOFOL 10 MG/ML IV BOLUS
INTRAVENOUS | Status: AC
  Filled 2022-03-02: qty 20

## 2022-03-02 MED ORDER — ROCURONIUM BROMIDE 100 MG/10ML IV SOLN
INTRAVENOUS | Status: DC | PRN
  Administered 2022-03-02: 60 mg via INTRAVENOUS

## 2022-03-02 MED ORDER — CHLORHEXIDINE GLUCONATE CLOTH 2 % EX PADS: 6.0000 | MEDICATED_PAD | Freq: Once | CUTANEOUS | Status: DC

## 2022-03-02 MED ORDER — MEPERIDINE HCL 50 MG/ML IJ SOLN: 6.2500 mg | INTRAMUSCULAR | Status: DC | PRN

## 2022-03-02 MED ORDER — OXYCODONE HCL 5 MG/5ML PO SOLN: 5.0000 mg | Freq: Once | ORAL | Status: AC | PRN

## 2022-03-02 MED ORDER — LIDOCAINE HCL (CARDIAC) PF 100 MG/5ML IV SOSY
PREFILLED_SYRINGE | INTRAVENOUS | Status: DC | PRN
  Administered 2022-03-02: 50 mg via INTRAVENOUS

## 2022-03-02 MED ORDER — LACTATED RINGERS IV SOLN: INTRAVENOUS | Status: DC

## 2022-03-02 MED ORDER — HYDROMORPHONE HCL 1 MG/ML IJ SOLN: 0.2500 mg | INTRAMUSCULAR | Status: DC | PRN

## 2022-03-02 MED ORDER — ONDANSETRON HCL 4 MG/2ML IJ SOLN
INTRAMUSCULAR | Status: DC | PRN
  Administered 2022-03-02: 4 mg via INTRAVENOUS

## 2022-03-02 MED ORDER — BUPIVACAINE-EPINEPHRINE (PF) 0.25% -1:200000 IJ SOLN
INTRAMUSCULAR | Status: AC
  Filled 2022-03-02: qty 30

## 2022-03-02 MED ORDER — 0.9 % SODIUM CHLORIDE (POUR BTL) OPTIME
TOPICAL | Status: DC | PRN
  Administered 2022-03-02: 1000 mL

## 2022-03-02 MED ORDER — ONDANSETRON HCL 4 MG/2ML IJ SOLN
INTRAMUSCULAR | Status: AC
  Filled 2022-03-02: qty 2

## 2022-03-02 MED ORDER — MIDAZOLAM HCL 2 MG/2ML IJ SOLN: 0.5000 mg | Freq: Once | INTRAMUSCULAR | Status: DC | PRN

## 2022-03-02 MED ORDER — DEXMEDETOMIDINE HCL IN NACL 80 MCG/20ML IV SOLN
INTRAVENOUS | Status: AC
  Filled 2022-03-02: qty 20

## 2022-03-02 MED ORDER — PROPOFOL 10 MG/ML IV BOLUS
INTRAVENOUS | Status: DC | PRN
  Administered 2022-03-02: 160 mg via INTRAVENOUS

## 2022-03-02 MED ORDER — PROMETHAZINE HCL 25 MG/ML IJ SOLN: 6.2500 mg | INTRAMUSCULAR | Status: DC | PRN

## 2022-03-02 MED ORDER — ORAL CARE MOUTH RINSE: 15.0000 mL | Freq: Once | OROMUCOSAL | Status: AC

## 2022-03-02 MED ORDER — FENTANYL CITRATE PF 50 MCG/ML IJ SOSY
25.0000 ug | PREFILLED_SYRINGE | INTRAMUSCULAR | Status: DC | PRN
  Administered 2022-03-02 (×2): 25 ug via INTRAVENOUS

## 2022-03-02 MED ORDER — SODIUM CHLORIDE 0.9 % IV SOLN
2.0000 g | INTRAVENOUS | Status: AC
  Administered 2022-03-02: 2 g via INTRAVENOUS
  Filled 2022-03-02: qty 2

## 2022-03-02 MED ORDER — DEXAMETHASONE SODIUM PHOSPHATE 10 MG/ML IJ SOLN
INTRAMUSCULAR | Status: DC | PRN
  Administered 2022-03-02: 5 mg via INTRAVENOUS

## 2022-03-02 MED ORDER — FENTANYL CITRATE PF 50 MCG/ML IJ SOSY
PREFILLED_SYRINGE | INTRAMUSCULAR | Status: AC
  Filled 2022-03-02: qty 1

## 2022-03-02 MED ORDER — LIDOCAINE HCL (PF) 2 % IJ SOLN
INTRAMUSCULAR | Status: AC
  Filled 2022-03-02: qty 5

## 2022-03-02 MED ORDER — FENTANYL CITRATE (PF) 250 MCG/5ML IJ SOLN
INTRAMUSCULAR | Status: AC
  Filled 2022-03-02: qty 5

## 2022-03-02 MED ORDER — LACTATED RINGERS IR SOLN
Status: DC | PRN
  Administered 2022-03-02: 1000 mL

## 2022-03-02 MED ORDER — CHLORHEXIDINE GLUCONATE 0.12 % MT SOLN
15.0000 mL | Freq: Once | OROMUCOSAL | Status: AC
  Administered 2022-03-02: 15 mL via OROMUCOSAL

## 2022-03-02 MED ORDER — OXYCODONE HCL 5 MG PO TABS: 5.0000 mg | ORAL_TABLET | Freq: Four times a day (QID) | ORAL | 0 refills | Status: DC | PRN

## 2022-03-02 MED ORDER — FENTANYL CITRATE (PF) 100 MCG/2ML IJ SOLN
INTRAMUSCULAR | Status: DC | PRN
  Administered 2022-03-02 (×5): 50 ug via INTRAVENOUS

## 2022-03-02 MED ORDER — INDOCYANINE GREEN 25 MG IV SOLR
INTRAVENOUS | Status: DC | PRN
  Administered 2022-03-02: 2.5 mg via INTRAVENOUS

## 2022-03-02 MED ORDER — ACETAMINOPHEN 500 MG PO TABS: 1000.0000 mg | ORAL_TABLET | Freq: Four times a day (QID) | ORAL | 0 refills | Status: AC | PRN

## 2022-03-02 MED ORDER — ACETAMINOPHEN 500 MG PO TABS
1000.0000 mg | ORAL_TABLET | ORAL | Status: AC
  Administered 2022-03-02: 1000 mg via ORAL
  Filled 2022-03-02: qty 2

## 2022-03-02 MED ORDER — ROCURONIUM BROMIDE 10 MG/ML (PF) SYRINGE
PREFILLED_SYRINGE | INTRAVENOUS | Status: AC
  Filled 2022-03-02: qty 10

## 2022-03-02 SURGICAL SUPPLY — 48 items
APPLICATOR ARISTA FLEXITIP XL (MISCELLANEOUS) IMPLANT
APPLIER CLIP 5 13 M/L LIGAMAX5 (MISCELLANEOUS)
APPLIER CLIP ROT 10 11.4 M/L (STAPLE)
BAG COUNTER SPONGE SURGICOUNT (BAG) IMPLANT
CABLE HIGH FREQUENCY MONO STRZ (ELECTRODE) ×1 IMPLANT
CHLORAPREP W/TINT 26 (MISCELLANEOUS) ×1 IMPLANT
CLIP APPLIE 5 13 M/L LIGAMAX5 (MISCELLANEOUS) IMPLANT
CLIP APPLIE ROT 10 11.4 M/L (STAPLE) IMPLANT
CLIP LIGATING HEMO O LOK GREEN (MISCELLANEOUS) IMPLANT
COVER MAYO STAND XLG (MISCELLANEOUS) IMPLANT
COVER SURGICAL LIGHT HANDLE (MISCELLANEOUS) ×1 IMPLANT
DERMABOND ADVANCED .7 DNX12 (GAUZE/BANDAGES/DRESSINGS) IMPLANT
DRAPE C-ARM 42X120 X-RAY (DRAPES) IMPLANT
DRSG TEGADERM 2-3/8X2-3/4 SM (GAUZE/BANDAGES/DRESSINGS) ×3 IMPLANT
DRSG TEGADERM 4X4.75 (GAUZE/BANDAGES/DRESSINGS) ×1 IMPLANT
ELECT REM PT RETURN 15FT ADLT (MISCELLANEOUS) ×1 IMPLANT
GAUZE SPONGE 2X2 8PLY STRL LF (GAUZE/BANDAGES/DRESSINGS) ×1 IMPLANT
GLOVE BIO SURGEON STRL SZ7.5 (GLOVE) ×1 IMPLANT
GLOVE INDICATOR 8.0 STRL GRN (GLOVE) ×1 IMPLANT
GOWN STRL REUS W/ TWL XL LVL3 (GOWN DISPOSABLE) ×1 IMPLANT
GOWN STRL REUS W/TWL XL LVL3 (GOWN DISPOSABLE) ×1
GRASPER SUT TROCAR 14GX15 (MISCELLANEOUS) IMPLANT
HEMOSTAT ARISTA ABSORB 3G PWDR (HEMOSTASIS) IMPLANT
HEMOSTAT SNOW SURGICEL 2X4 (HEMOSTASIS) IMPLANT
IRRIG SUCT STRYKERFLOW 2 WTIP (MISCELLANEOUS) ×1
IRRIGATION SUCT STRKRFLW 2 WTP (MISCELLANEOUS) ×1 IMPLANT
KIT BASIN OR (CUSTOM PROCEDURE TRAY) ×1 IMPLANT
KIT TURNOVER KIT A (KITS) IMPLANT
L-HOOK LAP DISP 36CM (ELECTROSURGICAL)
LHOOK LAP DISP 36CM (ELECTROSURGICAL) IMPLANT
POUCH RETRIEVAL ECOSAC 10 (ENDOMECHANICALS) ×1 IMPLANT
POUCH RETRIEVAL ECOSAC 10MM (ENDOMECHANICALS) ×1
SCISSORS LAP 5X35 DISP (ENDOMECHANICALS) ×1 IMPLANT
SET CHOLANGIOGRAPH MIX (MISCELLANEOUS) IMPLANT
SET TUBE SMOKE EVAC HIGH FLOW (TUBING) ×1 IMPLANT
SLEEVE Z-THREAD 5X100MM (TROCAR) ×2 IMPLANT
SPIKE FLUID TRANSFER (MISCELLANEOUS) ×1 IMPLANT
STRIP CLOSURE SKIN 1/2X4 (GAUZE/BANDAGES/DRESSINGS) ×1 IMPLANT
SUT MNCRL AB 4-0 PS2 18 (SUTURE) ×1 IMPLANT
SUT VIC AB 0 UR5 27 (SUTURE) IMPLANT
SUT VICRYL 0 TIES 12 18 (SUTURE) IMPLANT
SUT VICRYL 0 UR6 27IN ABS (SUTURE) IMPLANT
TOWEL OR 17X26 10 PK STRL BLUE (TOWEL DISPOSABLE) ×1 IMPLANT
TOWEL OR NON WOVEN STRL DISP B (DISPOSABLE) ×1 IMPLANT
TRAY LAPAROSCOPIC (CUSTOM PROCEDURE TRAY) ×1 IMPLANT
TROCAR 11X100 Z THREAD (TROCAR) IMPLANT
TROCAR BALLN 12MMX100 BLUNT (TROCAR) ×1 IMPLANT
TROCAR Z-THREAD OPTICAL 5X100M (TROCAR) ×1 IMPLANT

## 2022-03-02 NOTE — Anesthesia Preprocedure Evaluation (Addendum)
Anesthesia Evaluation  Patient identified by MRN, date of birth, ID band Patient awake    Reviewed: Allergy & Precautions, NPO status , Patient's Chart, lab work & pertinent test results  History of Anesthesia Complications (+) PONV  Airway Mallampati: II  TM Distance: >3 FB Neck ROM: Full    Dental  (+) Teeth Intact, Dental Advisory Given   Pulmonary neg pulmonary ROS,    Pulmonary exam normal        Cardiovascular (-) anginanegative cardio ROS   Rhythm:Regular Rate:Normal     Neuro/Psych  Headaches, Anxiety    GI/Hepatic Neg liver ROS, GERD  Controlled,cholelithiasis   Endo/Other  negative endocrine ROS  Renal/GU negative Renal ROS     Musculoskeletal   Abdominal   Peds  Hematology negative hematology ROS (+)   Anesthesia Other Findings   Reproductive/Obstetrics                            Anesthesia Physical Anesthesia Plan  ASA: 2  Anesthesia Plan: General   Post-op Pain Management: Tylenol PO (pre-op)*   Induction: Intravenous  PONV Risk Score and Plan: 4 or greater and Ondansetron, Dexamethasone and Scopolamine patch - Pre-op  Airway Management Planned: Oral ETT  Additional Equipment: None  Intra-op Plan:   Post-operative Plan: Extubation in OR  Informed Consent: I have reviewed the patients History and Physical, chart, labs and discussed the procedure including the risks, benefits and alternatives for the proposed anesthesia with the patient or authorized representative who has indicated his/her understanding and acceptance.     Dental advisory given  Plan Discussed with: CRNA and Surgeon  Anesthesia Plan Comments:        Anesthesia Quick Evaluation

## 2022-03-02 NOTE — Anesthesia Postprocedure Evaluation (Signed)
Anesthesia Post Note  Patient: Dawn [name]  Procedure(s) Performed: LAPAROSCOPIC CHOLECYSTECTOMY WITH ICG DYE, POSS IOC INDOCYANINE GREEN FLUORESCENCE IMAGING (ICG)     Patient location during evaluation: PACU Anesthesia Type: General Level of consciousness: awake and alert Pain management: pain level controlled Vital Signs Assessment: post-procedure vital signs reviewed and stable Respiratory status: spontaneous breathing, nonlabored ventilation and respiratory function stable Cardiovascular status: blood pressure returned to baseline and stable Postop Assessment: no apparent nausea or vomiting Anesthetic complications: no   No notable events documented.  Last Vitals:  Vitals:   03/02/22 1145 03/02/22 1156  BP: (!) 136/91 (!) 144/95  Pulse: 85 88  Resp: 15 16  Temp: 36.6 C 36.6 C  SpO2:  99%    Last Pain:  Vitals:   03/02/22 1156  TempSrc: Oral  PainSc: 3                  Joany Khatib,E. Johnnathan Hagemeister

## 2022-03-02 NOTE — Interval H&P Note (Signed)
History and Physical Interval Note:  03/02/2022 9:17 AM  Dawn [name]  has presented today for surgery, with the diagnosis of symptomatic cholelithiasis.  The various methods of treatment have been discussed with the patient and family. After consideration of risks, benefits and other options for treatment, the patient has consented to  Procedure(s): LAPAROSCOPIC CHOLECYSTECTOMY WITH ICG DYE, POSS IOC (N/A) INDOCYANINE GREEN FLUORESCENCE IMAGING (ICG) (N/A) as a surgical intervention.  The patient's history has been reviewed, patient examined, no change in status, stable for surgery.  I have reviewed the patient's chart and labs.  Questions were answered to the patient's satisfaction.     Dawn [name]

## 2022-03-02 NOTE — Anesthesia Procedure Notes (Signed)
Procedure Name: Intubation Date/Time: 03/02/2022 10:03 AM  Performed by: Garrel Ridgel, CRNAPre-anesthesia Checklist: Patient identified, Emergency Drugs available, Suction available and Patient being monitored Patient Re-evaluated:Patient Re-evaluated prior to induction Oxygen Delivery Method: Circle system utilized Preoxygenation: Pre-oxygenation with 100% oxygen Induction Type: IV induction Ventilation: Mask ventilation without difficulty Laryngoscope Size: Mac and 3 Grade View: Grade I Tube type: Oral Tube size: 7.0 mm Number of attempts: 1 Airway Equipment and Method: Stylet and Oral airway Placement Confirmation: ETT inserted through vocal cords under direct vision, positive ETCO2 and breath sounds checked- equal and bilateral Secured at: 21 cm Tube secured with: Tape Dental Injury: Teeth and Oropharynx as per pre-operative assessment

## 2022-03-02 NOTE — Op Note (Signed)
Dawn [name] [id number] 1975/12/18 03/02/2022  Laparoscopic Cholecystectomy with near infrared fluorescent cholangiography procedure Note  Indications: This patient presents with symptomatic gallbladder disease and will undergo laparoscopic cholecystectomy.  Pre-operative Diagnosis: symptomatic cholelithiasis  Post-operative Diagnosis: same  Surgeon: Greer Pickerel MD FACS  Assistants: Toula Moos PGY-4  Anesthesia: General endotracheal anesthesia  Procedure Details  The patient was seen again in the Holding Room. The risks, benefits, complications, treatment options, and expected outcomes were discussed with the patient. The possibilities of reaction to medication, pulmonary aspiration, perforation of viscus, bleeding, recurrent infection, finding a normal gallbladder, the need for additional procedures, failure to diagnose a condition, the possible need to convert to an open procedure, and creating a complication requiring transfusion or operation were discussed with the patient. The likelihood of improving the patient's symptoms with return to their baseline status is good.  The patient and/or family concurred with the proposed plan, giving informed consent. The site of surgery properly noted. The patient was taken to Operating Room, identified as Dawn [name] and the procedure verified as Laparoscopic Cholecystectomy with ICG dye.  A Time Out was held and the above information confirmed. Antibiotic prophylaxis was administered.   Prior to the induction of general anesthesia, antibiotic prophylaxis was administered. General endotracheal anesthesia was then administered and tolerated well. After the induction, the abdomen was prepped with Chloraprep and draped in the sterile fashion. The patient was positioned in the supine position.  Local anesthetic agent was injected into the skin near the umbilicus and an incision made. We dissected down to the abdominal fascia with blunt dissection.  The  fascia was incised vertically and we entered the peritoneal cavity bluntly.  A pursestring suture of 0-Vicryl was placed around the fascial opening.  The Hasson cannula was inserted and secured with the stay suture.  Pneumoperitoneum was then created with CO2 and tolerated well without any adverse changes in the patient's vital signs. An 5-mm port was placed in the subxiphoid position.  Two 5-mm ports were placed in the right upper quadrant. All skin incisions were infiltrated with a local anesthetic agent before making the incision and placing the trocars.   We positioned the patient in reverse Trendelenburg, tilted slightly to the patient's left.  The gallbladder was identified, the fundus grasped and retracted cephalad. Adhesions were lysed bluntly and with the electrocautery where indicated, taking care not to injure any adjacent organs or viscus. The infundibulum was grasped and retracted laterally, exposing the peritoneum overlying the triangle of Calot. This was then divided and exposed in a blunt fashion. A critical view of the cystic duct and cystic artery was obtained.  The cystic duct was clearly identified and bluntly dissected circumferentially.  Utilizing the Stryker camera system near infrared immunofluorescent activity was visualized in the liver, cystic duct, common hepatic duct and common bile duct.  This served as a secondary confirmation of our anatomy.  The cystic duct was then ligated with hemolock clips and divided. The cystic artery which had been identified & dissected free was ligated with hemolock clips and divided as well.   The gallbladder was dissected from the liver bed in retrograde fashion with the electrocautery. The gallbladder was removed and placed in an Ecco sac.  The gallbladder and Ecco sac were then removed through the umbilical port site. The liver bed was irrigated and inspected. Hemostasis was achieved with the electrocautery. Copious irrigation was utilized and was  repeatedly aspirated until clear.  The pursestring suture was used to close  the umbilical fascia.  An additional interrupted Vicryl was placed at the umbilical fascia using a PMI suture passer with laparoscopic guidance.  There is no air leak.  We again inspected the right upper quadrant for hemostasis.  The umbilical closure was inspected and there was no air leak and nothing trapped within the closure. Pneumoperitoneum was released as we removed the trocars.  4-0 Monocryl was used to close the skin.    steri-strips, and clean dressings were applied. The patient was then extubated and brought to the recovery room in stable condition. Instrument, sponge, and needle counts were correct at closure and at the conclusion of the case.   Findings: Positive critical view Near infrared fluorescent activity seen within the cystic duct, common bile duct, common hepatic duct. Estimated Blood Loss: Minimal         Drains: none         Specimens: Gallbladder           Complications: None; patient tolerated the procedure well.         Disposition: PACU - hemodynamically stable.         Condition: stable  Dawn [name]. Redmond Pulling, MD, FACS General, Bariatric, & Minimally Invasive Surgery [facility],  [place]

## 2022-03-02 NOTE — H&P (Signed)
CC: here for  Requesting provider: [facility]   HPI: Dawn [name] is an 46 y.o. female who is here for laparoscopic cholecystectomy with ICG dye.  She denies any changes since I initially met her in clinic back in May.  Old hpi: Dawn [name] is a 46 y.o. female who is seen today as an office consultation at the request of Heinz PA at [place] for evaluation of New Patient .   She states that she has had right-sided discomfort on and off for a few years. She thought it was due to her chronic constipation. She describes it as a sharp pain on her right side. Occasionally she will have epigastric discomfort as well which she describes as a rock sensation. Occasionally associate with nausea. She does have bloating. She does have chronic constipation. She averages a bowel movement about every other day. She states that she had a stool study that showed that the pills she ingested were still in her colon after about 7 days. She has had several prior abdominal pelvic surgeries. She will generally awake with the right-sided discomfort. She cannot really tell if it is food related. No vomiting. Occasional emesis. No unplanned weight loss. She has had a colonoscopy. She had an abdominal ultrasound which showed numerous gallstones and a normal common bile duct.  Past Medical History:  Diagnosis Date   Anemia    during pregnancy   Anxiety    Asthma    cold induced asthma   Complication of anesthesia    nausea and vomiting after c section   Family history of adverse reaction to anesthesia    PONV   GERD (gastroesophageal reflux disease)    granulosa cell tumor 06/2011   left ovary    Headache    History of kidney stones    Overweight (BMI 25.0-29.9)    Plantar fasciitis    PONV (postoperative nausea and vomiting)    Pre-diabetes     Past Surgical History:  Procedure Laterality Date   ABDOMINAL HYSTERECTOMY  2016   APPENDECTOMY  2013   CESAREAN SECTION  2004   breech birth    [place], with some resiudal loss of hearing   COLONOSCOPY     INNER EAR SURGERY     LEFT OOPHORECTOMY  2013   secondary to granulosa cell tumor   MASS EXCISION Left 10/15/2020   Procedure: EXCISION TUMOR FOOT - SUBQ. LEFT;  Surgeon: Sharlotte Alamo, DPM;  Location: [facility] ORS;  Service: Podiatry;  Laterality: Left;   WISDOM TOOTH EXTRACTION      Family History  Problem Relation Age of Onset   Cancer Mother        Lung Cancer, tobacco related   Cancer Father        melanoma   Diabetes Maternal Grandmother    Diabetes Paternal Grandfather    Breast cancer Cousin 71    Social:  reports that she has never smoked. She has never used smokeless tobacco. She reports current alcohol use. She reports that she does not use drugs.  Allergies:  Allergies  Allergen Reactions   Sulfa Drugs Cross Reactors Anaphylaxis   Vicodin Hp [Hydrocodone-Acetaminophen] Swelling    Facial swelling    Pumpkin Seed Oil Hives    pumpkins   Celebrex [Celecoxib] Swelling and Rash    Medications: I have reviewed the patient's current medications.   ROS - all of the below systems have been reviewed with the patient and  positives are indicated with bold text General: chills, fever or night sweats Eyes: blurry vision or double vision ENT: epistaxis or sore throat Allergy/Immunology: itchy/watery eyes or nasal congestion Hematologic/Lymphatic: bleeding problems, blood clots or swollen lymph nodes Endocrine: temperature intolerance or unexpected weight changes Breast: new or changing breast lumps or nipple discharge Resp: cough, shortness of breath, or wheezing CV: chest pain or dyspnea on exertion GI: as per HPI GU: dysuria, trouble voiding, or hematuria MSK: joint pain or joint stiffness Neuro: TIA or stroke symptoms Derm: pruritus and skin lesion changes Psych: anxiety and depression  PE Blood pressure 121/81, pulse 73, temperature 98.2 F (36.8 C), temperature source Oral,  resp. rate 16, last menstrual period 11/22/2012, SpO2 100 %. Constitutional: NAD; conversant; no deformities Eyes: Moist conjunctiva; no lid lag; anicteric; PERRL Neck: Trachea midline; no thyromegaly Lungs: Normal respiratory effort; no tactile fremitus CV: RRR; no palpable thrills; no pitting edema GI: Abd old trocar scars, old c/s scar; no palpable hepatosplenomegaly MSK: Normal gait; no clubbing/cyanosis Psychiatric: Appropriate affect; alert and oriented x3 Lymphatic: No palpable cervical or axillary lymphadenopathy Skin:no rash  Results for orders placed or performed during the hospital encounter of 03/02/22 (from the past 48 hour(s))  Glucose, capillary     Status: Abnormal   Collection Time: 03/02/22  7:59 AM  Result Value Ref Range   Glucose-Capillary 102 (H) 70 - 99 mg/dL    Comment: Glucose reference range applies only to samples taken after fasting for at least 8 hours.   Comment 1 Notify RN    Comment 2 Document in Chart     No results found.  Imaging: reviewed  A/P: Dawn [name] is an 46 y.o. female with symptomatic cholelithiasis  2 OR for laparoscopic cholecystectomy with ICG dye and possible cholangiogram IV antibiotic Enhanced recovery protocol Discussed surgical resident involvement with the case  Leighton Ruff. Redmond Pulling, MD, FACS General, Bariatric, & Minimally Invasive Surgery [place]

## 2022-03-02 NOTE — Transfer of Care (Signed)
Immediate Anesthesia Transfer of Care Note  Patient: Dawn [name]  Procedure(s) Performed: LAPAROSCOPIC CHOLECYSTECTOMY WITH ICG DYE, POSS IOC INDOCYANINE GREEN FLUORESCENCE IMAGING (ICG)  Patient Location: PACU  Anesthesia Type:General  Level of Consciousness: drowsy and patient cooperative  Airway & Oxygen Therapy: Patient Spontanous Breathing and Patient connected to face mask oxygen  Post-op Assessment: Report given to RN and Post -op Vital signs reviewed and stable  Post vital signs: Reviewed and stable  Last Vitals:  Vitals Value Taken Time  BP 141/87 03/02/22 1118  Temp 36.6 C 03/02/22 1118  Pulse 91 03/02/22 1121  Resp 13 03/02/22 1121  SpO2 100 % 03/02/22 1121  Vitals shown include unvalidated device data.  Last Pain:  Vitals:   03/02/22 1118  TempSrc:   PainSc: 4          Complications: No notable events documented.

## 2022-03-03 ENCOUNTER — Encounter (HOSPITAL_COMMUNITY): Payer: Self-pay | Admitting: General Surgery

## 2022-03-03 LAB — SURGICAL PATHOLOGY

## 2022-03-08 ENCOUNTER — Telehealth: Payer: Self-pay | Admitting: Family Medicine

## 2022-03-08 NOTE — Telephone Encounter (Signed)
Patient called with concerns about a bill she received for sending in MyChart messages regarding medical advise.  She did coorespond with Dr. Kennon Rounds and a charge was billed.    The patient declines checking a box giving this consent.   IT ticket placed to see if they are able to retreive this information or not.   Patient elected to cancel her upcoming annual exam with Dr. Kennon Rounds.

## 2022-03-15 ENCOUNTER — Ambulatory Visit: Payer: BC Managed Care – PPO | Admitting: Family Medicine

## 2022-05-29 ENCOUNTER — Ambulatory Visit: Payer: BC Managed Care – PPO | Admitting: Internal Medicine

## 2022-07-11 ENCOUNTER — Encounter: Payer: Self-pay | Admitting: Internal Medicine

## 2022-07-11 ENCOUNTER — Ambulatory Visit (INDEPENDENT_AMBULATORY_CARE_PROVIDER_SITE_OTHER): Payer: BC Managed Care – PPO | Admitting: Internal Medicine

## 2022-07-11 ENCOUNTER — Ambulatory Visit: Payer: BC Managed Care – PPO | Admitting: Internal Medicine

## 2022-07-11 VITALS — BP 134/82 | HR 100 | Temp 97.7°F | Ht 66.0 in | Wt 165.0 lb

## 2022-07-11 DIAGNOSIS — Z1231 Encounter for screening mammogram for malignant neoplasm of breast: Secondary | ICD-10-CM | POA: Diagnosis not present

## 2022-07-11 DIAGNOSIS — K5909 Other constipation: Secondary | ICD-10-CM | POA: Diagnosis not present

## 2022-07-11 DIAGNOSIS — I10 Essential (primary) hypertension: Secondary | ICD-10-CM | POA: Insufficient documentation

## 2022-07-11 DIAGNOSIS — G43909 Migraine, unspecified, not intractable, without status migrainosus: Secondary | ICD-10-CM | POA: Insufficient documentation

## 2022-07-11 DIAGNOSIS — F411 Generalized anxiety disorder: Secondary | ICD-10-CM

## 2022-07-11 DIAGNOSIS — Z6826 Body mass index (BMI) 26.0-26.9, adult: Secondary | ICD-10-CM

## 2022-07-11 DIAGNOSIS — E663 Overweight: Secondary | ICD-10-CM

## 2022-07-11 DIAGNOSIS — F5104 Psychophysiologic insomnia: Secondary | ICD-10-CM

## 2022-07-11 DIAGNOSIS — G43019 Migraine without aura, intractable, without status migrainosus: Secondary | ICD-10-CM

## 2022-07-11 DIAGNOSIS — E782 Mixed hyperlipidemia: Secondary | ICD-10-CM

## 2022-07-11 DIAGNOSIS — R7303 Prediabetes: Secondary | ICD-10-CM

## 2022-07-11 DIAGNOSIS — Z8543 Personal history of malignant neoplasm of ovary: Secondary | ICD-10-CM

## 2022-07-11 DIAGNOSIS — M545 Low back pain, unspecified: Secondary | ICD-10-CM

## 2022-07-11 MED ORDER — HAILEY 24 FE 1-20 MG-MCG(24) PO TABS: 1.0000 | ORAL_TABLET | Freq: Every day | ORAL | 1 refills | Status: AC

## 2022-07-11 NOTE — Assessment & Plan Note (Signed)
Encourage low-carb diet and exercise for weight loss 

## 2022-07-11 NOTE — Assessment & Plan Note (Signed)
Encourage diet and exercise for weight loss 

## 2022-07-11 NOTE — Assessment & Plan Note (Signed)
In remission.

## 2022-07-11 NOTE — Assessment & Plan Note (Signed)
Continue magnesium Encourage high-fiber diet and adequate water intake

## 2022-07-11 NOTE — Assessment & Plan Note (Signed)
Stable on candesartan Reinforced DASH diet and exercise for weight loss

## 2022-07-11 NOTE — Assessment & Plan Note (Signed)
Encourage regular stretching and core strengthening Okay to continue Tylenol and ibuprofen OTC

## 2022-07-11 NOTE — Patient Instructions (Signed)

## 2022-07-11 NOTE — Assessment & Plan Note (Signed)
Not medicated Support offered

## 2022-07-11 NOTE — Progress Notes (Signed)
HPI  Patient presents to clinic today to establish care and for management of the conditions listed below.  Chronic Constipation: She takes Magnesium  with good relief of symptoms.  Colonoscopy from 03/2018 reviewed.  GAD:  Situational, mainly with driving. She is not currently taking any medications for this. She is not currently seeing a therapist. She denies depression, SI/HI.  HTN: Her BP today is 134/82. She is taking Candesartan as prescribed. There is no ECG on file.  Insomnia: Due to neurological jerk. She has difficulty staying asleep.  She is taking Trazodone and Melatonin as prescribed with good relief of symptoms.  There is no sleep study on file but she reports she has had this done in the past.  History of Left Ovarian Cancer: s/p salpingoopherectomy. She did not have chemo or radiation.  Low Back Pain: She takes Tylenol or Ibuprofen as needed with good relief of symptoms.  She does not follow with orthopedics.  Migraines: These occur 2-3 times per month.  She is not sure what triggers this. She is taking Candesartan, Magnesium, Baclofen, Topamax and Fioricet as prescribed.  She follows with neurology.  Prediabetes: Her last A1c was 5.8%, 02/2022.  She is not taking any oral diabetic medication at this time.  She does not check her sugars.  HLD: Her last LDL was 106, triglycerides 160, 2016.  She is not taking any cholesterol-lowering medication at this time.  She does not consume a low-fat diet.  Past Medical History:  Diagnosis Date   Anemia    during pregnancy   Anxiety    Asthma    cold induced asthma   Complication of anesthesia    nausea and vomiting after c section   Family history of adverse reaction to anesthesia    PONV   GERD (gastroesophageal reflux disease)    granulosa cell tumor 06/2011   left ovary    Headache    History of kidney stones    Overweight (BMI 25.0-29.9)    Plantar fasciitis    PONV (postoperative nausea and vomiting)    Pre-diabetes      Current Outpatient Medications  Medication Sig Dispense Refill   baclofen (LIORESAL) 10 MG tablet Take 10 mg by mouth 2 (two) times daily.     Bismuth Subsalicylate (PEPTO-BISMOL) 262 MG TABS Take 524 mg by mouth every 6 (six) hours as needed (indigestion).     butalbital-acetaminophen-caffeine (FIORICET) 50-325-40 MG tablet Take 2 tablets by mouth daily as needed for migraine.     candesartan (ATACAND) 4 MG tablet Take 4 mg by mouth daily.     cetirizine (ZYRTEC) 10 MG tablet Take 10 mg by mouth daily as needed for allergies.     ciprofloxacin-dexamethasone (CIPRODEX) OTIC suspension Place 4 drops into the left ear daily as needed (infection).     diphenhydrAMINE (BENADRYL) 25 MG tablet Take 25 mg by mouth daily as needed for itching.     erythromycin with ethanol (EMGEL) 2 % gel Apply 1 Application topically 2 (two) times daily as needed (rosacea).     loratadine (CLARITIN) 10 MG tablet Take 10 mg by mouth daily as needed for allergies.     Magnesium 400 MG TABS Take 400 mg by mouth 2 (two) times daily.     Melatonin 5 MG CAPS Take 5 mg by mouth at bedtime.     Norethin Ace-Eth Estrad-FE (HAILEY 24 FE PO) Take 1 tablet by mouth daily.     oxyCODONE (OXY IR/ROXICODONE) 5 MG immediate release  tablet Take 1 tablet (5 mg total) by mouth every 6 (six) hours as needed for severe pain. 15 tablet 0   phentermine 15 MG capsule Take 15 mg by mouth every morning.     POTASSIUM PO Take 1 tablet by mouth daily as needed (cramps).     Riboflavin (B2) 100 MG TABS Take 100 mg by mouth 2 (two) times daily.     Simethicone (GAS-X PO) Take 2-3 tablets by mouth daily as needed (gas).     topiramate (TOPAMAX) 25 MG tablet Take 25 mg by mouth 2 (two) times daily.     traZODone (DESYREL) 50 MG tablet Take 25 mg by mouth at bedtime.     No current facility-administered medications for this visit.    Allergies  Allergen Reactions   Sulfa Drugs Cross Reactors Anaphylaxis   Vicodin Hp  [Hydrocodone-Acetaminophen] Swelling    Facial swelling    Pumpkin Seed Oil Hives    pumpkins   Celebrex [Celecoxib] Swelling and Rash    Family History  Problem Relation Age of Onset   Cancer Mother        Lung Cancer, tobacco related   Cancer Father        melanoma   Diabetes Maternal Grandmother    Diabetes Paternal Grandfather    Breast cancer Cousin 55    Social History   Socioeconomic History   Marital status: Married    Spouse name: Danial   Number of children: 1   Years of education: Not on file   Highest education level: Not on file  Occupational History   Not on file  Tobacco Use   Smoking status: Never   Smokeless tobacco: Never  Vaping Use   Vaping Use: Never used  Substance and Sexual Activity   Alcohol use: Yes    Comment: Occasional   Drug use: No   Sexual activity: Not on file  Other Topics Concern   Not on file  Social History Narrative   Not on file   Social Determinants of Health   Financial Resource Strain: Not on file  Food Insecurity: Not on file  Transportation Needs: Not on file  Physical Activity: Not on file  Stress: Not on file  Social Connections: Not on file  Intimate Partner Violence: Not on file    ROS:  Constitutional: Patient reports intermittent headaches.  Denies fever, malaise, fatigue,  or abrupt weight changes.  HEENT: Denies eye pain, eye redness, ear pain, ringing in the ears, wax buildup, runny nose, nasal congestion, bloody nose, or sore throat. Respiratory: Denies difficulty breathing, shortness of breath, cough or sputum production.   Cardiovascular: Denies chest pain, chest tightness, palpitations or swelling in the hands or feet.  Gastrointestinal: Patient reports intermittent constipation and diarrhea.  Denies abdominal pain, bloating, or blood in the stool.  GU: Denies frequency, urgency, pain with urination, blood in urine, odor or discharge. Musculoskeletal: Patient reports intermittent low back pain.   Denies decrease in range of motion, difficulty with gait, or joint swelling.  Skin: Denies redness, rashes, lesions or ulcercations.  Neurological: Patient reports insomnia.  Denies dizziness, difficulty with memory, difficulty with speech or problems with balance and coordination.  Psych: Patient has a history of anxiety.  Denies depression, SI/HI.  No other specific complaints in a complete review of systems (except as listed in HPI above).  PE:  BP 134/82 (BP Location: Right Arm, Patient Position: Sitting, Cuff Size: Normal)   Pulse 100   Temp 97.7 F (  36.5 C) (Temporal)   Ht '5\' 6"'$  (1.676 m)   Wt 165 lb (74.8 kg)   LMP 11/22/2012   SpO2 100%   BMI 26.63 kg/m   Wt Readings from Last 3 Encounters:  03/02/22 158 lb 11.7 oz (72 kg)  02/17/22 158 lb 12.8 oz (72 kg)  03/31/21 156 lb (70.8 kg)    General: Appears her stated age, well developed, well nourished in NAD. HEENT: Head: normal shape and size; Eyes: sclera white, no icterus, conjunctiva pink, PERRLA and EOMs intact;  Cardiovascular: Normal rate and rhythm. S1,S2 noted.  No murmur, rubs or gallops noted. No JVD or BLE edema.  Pulmonary/Chest: Normal effort and positive vesicular breath sounds. No respiratory distress. No wheezes, rales or ronchi noted.  Abdomen: Normal bowel sounds. Musculoskeletal:  No difficulty with gait.  Neurological: Alert and oriented.  Coordination normal.  Psychiatric: Mood and affect normal. Behavior is normal. Judgment and thought content normal.     BMET    Component Value Date/Time   NA 139 02/17/2022 0830   NA 139 07/15/2014 1142   K 4.3 02/17/2022 0830   K 4.4 07/15/2014 1142   CL 107 02/17/2022 0830   CL 104 07/15/2014 1142   CO2 24 02/17/2022 0830   CO2 25 07/15/2014 1142   GLUCOSE 118 (H) 02/17/2022 0830   GLUCOSE 80 07/15/2014 1142   BUN 21 (H) 02/17/2022 0830   BUN 14 07/15/2014 1142   CREATININE 0.97 02/17/2022 0830   CREATININE 0.65 07/15/2014 1142   CREATININE 0.64  11/21/2011 0849   CALCIUM 9.5 02/17/2022 0830   CALCIUM 8.9 07/15/2014 1142   GFRNONAA >60 02/17/2022 0830   GFRNONAA >60 07/15/2014 1142   GFRNONAA >89 11/21/2011 0849   GFRAA >60 07/15/2014 1142   GFRAA >89 11/21/2011 0849    Lipid Panel     Component Value Date/Time   CHOL 215 (H) 05/24/2015 1750   TRIG 160.0 (H) 05/24/2015 1750   HDL 73.50 05/24/2015 1750   CHOLHDL 3 05/24/2015 1750   VLDL 32.0 05/24/2015 1750   LDLCALC 109 (H) 05/24/2015 1750    CBC    Component Value Date/Time   WBC 5.2 02/17/2022 0830   RBC 4.57 02/17/2022 0830   HGB 13.7 02/17/2022 0830   HGB 13.6 07/15/2014 1142   HCT 43.0 02/17/2022 0830   HCT 41.2 07/15/2014 1142   PLT 362 02/17/2022 0830   PLT 338 07/15/2014 1142   MCV 94.1 02/17/2022 0830   MCV 89 07/15/2014 1142   MCH 30.0 02/17/2022 0830   MCHC 31.9 02/17/2022 0830   RDW 12.9 02/17/2022 0830   RDW 12.9 07/15/2014 1142   LYMPHSABS 1.5 02/17/2022 0830   LYMPHSABS 1.6 07/15/2014 1142   MONOABS 0.4 02/17/2022 0830   MONOABS 0.4 07/15/2014 1142   EOSABS 0.1 02/17/2022 0830   EOSABS 0.0 07/15/2014 1142   BASOSABS 0.1 02/17/2022 0830   BASOSABS 0.0 07/15/2014 1142    Hgb A1C Lab Results  Component Value Date   HGBA1C 5.8 (H) 02/17/2022     Assessment and Plan:   RTC in 6 months for annual exam Webb Silversmith, NP

## 2022-07-11 NOTE — Assessment & Plan Note (Signed)
Encourage low saturated fat diet

## 2022-07-11 NOTE — Assessment & Plan Note (Signed)
Continue trazodone and melatonin. 

## 2022-07-11 NOTE — Assessment & Plan Note (Signed)
Continue candesartan, magnesium, baclofen, Topamax and Fioricet She will continue follow-up with neurology

## 2022-10-10 ENCOUNTER — Ambulatory Visit
Admission: RE | Admit: 2022-10-10 | Discharge: 2022-10-10 | Disposition: A | Discharge status: Home / Self Care | Payer: BC Managed Care – PPO | Source: Ambulatory Visit | Attending: Internal Medicine | Admitting: Internal Medicine

## 2022-10-10 DIAGNOSIS — Z1231 Encounter for screening mammogram for malignant neoplasm of breast: Secondary | ICD-10-CM | POA: Diagnosis not present

## 2023-01-09 ENCOUNTER — Encounter: Payer: BC Managed Care – PPO | Admitting: Internal Medicine

## 2023-02-18 ENCOUNTER — Other Ambulatory Visit: Payer: Self-pay | Admitting: Internal Medicine

## 2023-02-20 NOTE — Telephone Encounter (Signed)
Requested medication (s) are due for refill today: routing for review  Requested medication (s) are on the active medication list: yes  Last refill:   Future visit scheduled: no  Notes to clinic:  Unable to refill per protocol, another provider listed as PCP. Routing for approval.      Requested Prescriptions  Pending Prescriptions Disp Refills   HAILEY 24 FE 1-20 MG-MCG(24) tablet [Pharmacy Med Name: HAILEY 24 FE 1 MG-20 MCG TAB] 84 tablet 1    Sig: TAKE 1 TABLET BY MOUTH EVERY DAY     OB/GYN:  Contraceptives Passed - 02/18/2023  1:17 AM      Passed - Last BP in normal range    BP Readings from Last 1 Encounters:  07/11/22 134/82         Passed - Valid encounter within last 12 months    Recent Outpatient Visits           7 months ago Encounter for screening mammogram for malignant neoplasm of breast   [facility], [place], [place]              Passed - Patient is not a smoker
